# Patient Record
Sex: Male | Born: 1980 | Race: Black or African American | Hispanic: No | Marital: Married | Smoking: Current every day smoker
Health system: Southern US, Community
[De-identification: ages and names within clinical notes are randomized; demographics above are authoritative.]

## PROBLEM LIST (undated history)

## (undated) DIAGNOSIS — F1910 Other psychoactive substance abuse, uncomplicated: Secondary | ICD-10-CM

## (undated) DIAGNOSIS — F1010 Alcohol abuse, uncomplicated: Secondary | ICD-10-CM

---

## 2010-09-05 ENCOUNTER — Emergency Department (HOSPITAL_COMMUNITY)
Admission: EM | Admit: 2010-09-05 | Discharge: 2010-09-06 | Payer: Self-pay | Source: Home / Self Care | Admitting: Emergency Medicine

## 2010-09-10 ENCOUNTER — Emergency Department (HOSPITAL_COMMUNITY)
Admission: EM | Admit: 2010-09-10 | Discharge: 2010-09-10 | Payer: Self-pay | Source: Home / Self Care | Admitting: Emergency Medicine

## 2011-06-02 ENCOUNTER — Emergency Department (HOSPITAL_COMMUNITY)
Admission: EM | Admit: 2011-06-02 | Discharge: 2011-06-02 | Disposition: A | Discharge status: Home / Self Care | Payer: Self-pay | Attending: Emergency Medicine | Admitting: Emergency Medicine

## 2011-06-02 DIAGNOSIS — F10939 Alcohol use, unspecified with withdrawal, unspecified: Secondary | ICD-10-CM | POA: Insufficient documentation

## 2011-06-02 DIAGNOSIS — E876 Hypokalemia: Secondary | ICD-10-CM | POA: Insufficient documentation

## 2011-06-02 DIAGNOSIS — F1020 Alcohol dependence, uncomplicated: Secondary | ICD-10-CM | POA: Insufficient documentation

## 2011-06-02 DIAGNOSIS — F10239 Alcohol dependence with withdrawal, unspecified: Secondary | ICD-10-CM | POA: Insufficient documentation

## 2011-06-02 DIAGNOSIS — R112 Nausea with vomiting, unspecified: Secondary | ICD-10-CM | POA: Insufficient documentation

## 2011-06-02 DIAGNOSIS — F1010 Alcohol abuse, uncomplicated: Secondary | ICD-10-CM | POA: Insufficient documentation

## 2011-06-02 LAB — COMPREHENSIVE METABOLIC PANEL
ALT: 34 U/L (ref 0–53)
AST: 42 U/L — ABNORMAL HIGH (ref 0–37)
Albumin: 4.7 g/dL (ref 3.5–5.2)
Alkaline Phosphatase: 87 U/L (ref 39–117)
Alkaline Phosphatase: 79 U/L (ref 39–117)
BUN: 14 mg/dL (ref 6–23)
CO2: 27 mEq/L (ref 19–32)
Calcium: 10.2 mg/dL (ref 8.4–10.5)
Calcium: 9.3 mg/dL (ref 8.4–10.5)
Chloride: 94 mEq/L — ABNORMAL LOW (ref 96–112)
Creatinine, Ser: 1.5 mg/dL — ABNORMAL HIGH (ref 0.50–1.35)
GFR calc Af Amer: >60 mL/min (ref >60)
GFR calc Af Amer: >60 mL/min (ref >60)
GFR calc non Af Amer: >60 mL/min (ref >60)
Glucose, Bld: 114 mg/dL — ABNORMAL HIGH (ref 70–99)
Glucose, Bld: 107 mg/dL — ABNORMAL HIGH (ref 70–99)
Potassium: 3.9 mEq/L (ref 3.5–5.1)
Potassium: 2.9 mEq/L — ABNORMAL LOW (ref 3.5–5.1)
Sodium: 136 mEq/L (ref 135–145)
Total Protein: 8.6 g/dL — ABNORMAL HIGH (ref 6.0–8.3)

## 2011-06-02 LAB — CBC
HCT: 40.6 % (ref 39.0–52.0)
Hemoglobin: 14.6 g/dL (ref 13.0–17.0)
Hemoglobin: 13.1 g/dL (ref 13.0–17.0)
MCH: 30.2 pg (ref 26.0–34.0)
MCH: 28.7 pg (ref 26.0–34.0)
MCHC: 36 g/dL (ref 30.0–36.0)
Platelets: 121 10*3/uL — ABNORMAL LOW (ref 150–400)
RBC: 4.57 MIL/uL (ref 4.22–5.81)
RDW: 13.2 % (ref 11.5–15.5)
WBC: 7.3 10*3/uL (ref 4.0–10.5)

## 2011-06-02 LAB — LIPASE, BLOOD: Lipase: 26 U/L (ref 11–59)

## 2011-06-02 LAB — DIFFERENTIAL
Basophils Absolute: 0 10*3/uL (ref 0.0–0.1)
Basophils Relative: 1 % (ref 0–1)
Eosinophils Relative: 0 % (ref 0–5)
Lymphocytes Relative: 7 % — ABNORMAL LOW (ref 12–46)
Monocytes Absolute: 1 10*3/uL (ref 0.1–1.0)
Monocytes Relative: 12 % (ref 3–12)

## 2011-06-02 LAB — RAPID URINE DRUG SCREEN, HOSP PERFORMED
Barbiturates: NOT DETECTED
Cocaine: NOT DETECTED
Tetrahydrocannabinol: NOT DETECTED

## 2011-06-02 LAB — ETHANOL: Alcohol, Ethyl (B): <11 mg/dL (ref 0–11)

## 2011-07-06 ENCOUNTER — Emergency Department (HOSPITAL_COMMUNITY): Payer: Self-pay

## 2011-07-06 ENCOUNTER — Inpatient Hospital Stay (HOSPITAL_COMMUNITY)
Admission: EM | Admit: 2011-07-06 | Discharge: 2011-07-08 | DRG: 392 | Disposition: A | Discharge status: Home / Self Care | Payer: Self-pay | Attending: Family Medicine | Admitting: Family Medicine

## 2011-07-06 DIAGNOSIS — E860 Dehydration: Secondary | ICD-10-CM | POA: Diagnosis present

## 2011-07-06 DIAGNOSIS — F1010 Alcohol abuse, uncomplicated: Secondary | ICD-10-CM | POA: Diagnosis present

## 2011-07-06 DIAGNOSIS — N179 Acute kidney failure, unspecified: Secondary | ICD-10-CM | POA: Diagnosis present

## 2011-07-06 DIAGNOSIS — K2920 Alcoholic gastritis without bleeding: Principal | ICD-10-CM | POA: Diagnosis present

## 2011-07-06 LAB — URINE MICROSCOPIC-ADD ON

## 2011-07-06 LAB — URINALYSIS, ROUTINE W REFLEX MICROSCOPIC
Nitrite: NEGATIVE
Specific Gravity, Urine: 1.022 (ref 1.005–1.030)
Urobilinogen, UA: 1 mg/dL (ref 0.0–1.0)
pH: 5 (ref 5.0–8.0)

## 2011-07-06 LAB — RAPID URINE DRUG SCREEN, HOSP PERFORMED
Barbiturates: NOT DETECTED
Tetrahydrocannabinol: NOT DETECTED

## 2011-07-06 LAB — COMPREHENSIVE METABOLIC PANEL
ALT: 52 U/L (ref 0–53)
AST: 52 U/L — ABNORMAL HIGH (ref 0–37)
Albumin: 4.6 g/dL (ref 3.5–5.2)
Alkaline Phosphatase: 83 U/L (ref 39–117)
Chloride: 82 mEq/L — ABNORMAL LOW (ref 96–112)
Creatinine, Ser: 3.18 mg/dL — ABNORMAL HIGH (ref 0.50–1.35)
Potassium: 3.8 mEq/L (ref 3.5–5.1)
Sodium: 138 mEq/L (ref 135–145)
Total Bilirubin: 2.8 mg/dL — ABNORMAL HIGH (ref 0.3–1.2)

## 2011-07-06 LAB — PROTIME-INR
INR: 0.95 (ref 0.00–1.49)
Prothrombin Time: 12.9 seconds (ref 11.6–15.2)

## 2011-07-06 LAB — DIFFERENTIAL
Basophils Absolute: 0 10*3/uL (ref 0.0–0.1)
Eosinophils Absolute: 0 10*3/uL (ref 0.0–0.7)
Eosinophils Relative: 0 % (ref 0–5)
Lymphocytes Relative: 6 % — ABNORMAL LOW (ref 12–46)
Neutrophils Relative %: 77 % (ref 43–77)

## 2011-07-06 LAB — CBC
HCT: 44.2 % (ref 39.0–52.0)
Platelets: 224 10*3/uL (ref 150–400)
RBC: 5.11 MIL/uL (ref 4.22–5.81)
RDW: 13.3 % (ref 11.5–15.5)
WBC: 12.4 10*3/uL — ABNORMAL HIGH (ref 4.0–10.5)

## 2011-07-06 LAB — APTT: aPTT: 28 seconds (ref 24–37)

## 2011-07-06 LAB — LIPASE, BLOOD: Lipase: 23 U/L (ref 11–59)

## 2011-07-07 LAB — BASIC METABOLIC PANEL
CO2: 30 mEq/L (ref 19–32)
Calcium: 8.5 mg/dL (ref 8.4–10.5)
Creatinine, Ser: 0.97 mg/dL (ref 0.50–1.35)

## 2011-07-07 LAB — COMPREHENSIVE METABOLIC PANEL
ALT: 41 U/L (ref 0–53)
Alkaline Phosphatase: 64 U/L (ref 39–117)
CO2: 31 mEq/L (ref 19–32)
GFR calc Af Amer: >60 mL/min (ref >60)
Glucose, Bld: 105 mg/dL — ABNORMAL HIGH (ref 70–99)
Potassium: 3.6 mEq/L (ref 3.5–5.1)
Sodium: 137 mEq/L (ref 135–145)
Total Protein: 7.3 g/dL (ref 6.0–8.3)

## 2011-07-07 LAB — HEMOGLOBIN AND HEMATOCRIT, BLOOD: Hemoglobin: 12 g/dL — ABNORMAL LOW (ref 13.0–17.0)

## 2011-07-08 LAB — HEMOGLOBIN AND HEMATOCRIT, BLOOD
HCT: 34.6 % — ABNORMAL LOW (ref 39.0–52.0)
Hemoglobin: 11.3 g/dL — ABNORMAL LOW (ref 13.0–17.0)

## 2011-07-10 NOTE — H&P (Signed)
NAME:  Brian Black, OSE NO.:  [id number]  MEDICAL RECORD NO.:  [id number]  LOCATION:  1516                         FACILITY:  [facility]  PHYSICIAN:  Tarry Kos, MD       DATE OF BIRTH:  [birth year]  DATE OF ADMISSION:  07/06/2011 DATE OF DISCHARGE:                             HISTORY & PHYSICAL   CHIEF COMPLAINT:  Nausea, vomiting.  HISTORY OF PRESENT ILLNESS:  Brian Black is a 30 year old male with a history of alcohol abuse in the past, who drinks 40 to 80 ounces of beer a day, who comes in with 2 solid days of nausea and vomiting and he reports some coffee-ground nature to the vomit.  He has been having a significant alcohol problems since he turned 23.  He does have some epigastric abdominal pain.  Denies any diarrhea.  No chest pain.  He is not being eating for several days.  He has no history of renal problems in the past.  He has no liver damage, also that he is aware of.  He still has his gallbladder, still has his appendix.  We are being asked to admit the patient because he is currently renal failure with a creatinine of 3.19.  In August 2012, his creatinine was normal at 1.19.  PAST MEDICAL HISTORY:  Denies any other medical problems.  SOCIAL HISTORY:  He is a significant drinker.  He does smoke tobacco products.  Denies any other illicit drugs.  MEDICATIONS:  None.  ALLERGIES:  None.  PHYSICAL EXAMINATION:  VITAL SIGNS:  He is afebrile.  Blood pressure 124/90, pulse 107, respirations 16, 100% O2 sat on 2 liters nasal cannula. GENERAL:  He is alert and oriented x4, no apparent stress.  Calm and friendly. HEENT:  Extraocular muscles intact.  Pupils equal and reactive to light. Oropharynx is clear.  Mucous membranes dry. NECK:  No JVD.  No carotid bruits. COR:  Regular rate and rhythm without murmurs, rubs, or gallops. CHEST:  Clear to auscultation bilaterally.  No wheezes or rales. ABDOMEN:  Soft, nontender, and nondistended.  Positive bowel  sounds.  No hepatosplenomegaly.  No rebound, no guarding.  Nonacute abdomen. EXTREMITIES:  No clubbing, cyanosis, or edema. PSYCH:  Normal mood and affect.  At this time, no asterixis. NEURO:  No focal neurologic deficits. SKIN:  No rashes.  LABS:  His BUN and creatinine are significantly elevated at 24 and 3.18. His sodium is normal.  Potassium is normal.  His total bili is 2.8, which has been elevated in the past; in August, it was 3.3.  AST is 52, ALT is 52.  Alk phos is normal.  Lipase is normal.  Alcohol level is less than 11.  Urine drug screen is negative.  Urinalysis is positive for ketones, large amount of bilirubin.  White count is 12.4, hemoglobin is 15.  Ultrasound of the abdomen is pending.  ASSESSMENT AND PLAN:  This is a 30 year old male with nausea and vomiting likely secondary to alcohol abuse with acute renal failure. 1. Nausea and vomiting presumptively due to alcohol abuse.  I am going     to obtain an ultrasound of his abdomen, also to make sure that his  gallbladder is fine.  He had elevated total bili for several months     now.  He probably has the beginnings of liver damage.  I am going     to check coags and repeat a CMP in the morning.  He has been told     that he needs to stop drinking.  I am obtaining a substance abuse     counselor.  He has never had seizures in the past from stopping,     but he does get quite anxious.  I am going to provide him with some     Ativan as needed and Zofran along with thiamine and folate. 2. Acute renal failure secondary to dehydration.  We will obtain     ultrasound of his kidneys and aggressively fluid hydrate him.     Provide him some Zofran as needed. 3. We will also check serial hemoglobins as he has reported some     coffee-ground hematemesis.  His hemoglobin is normal.  I am going     to check q.8 H and H's. 4. The patient is full code.  Further recommendations is pending on     over hospital course.           ______________________________ Tarry Kos, MD     RD/MEDQ  D:  07/06/2011  T:  07/07/2011  Job:  [id number]  Electronically Signed by Tarry Kos MD on 07/10/2011 03:08:21 PM

## 2011-07-10 NOTE — Discharge Summary (Signed)
  NAMEMarland Kitchen  Brian Black, Brian Black  MEDICAL RECORD NO.:  Black  LOCATION:  1516                         FACILITY:  [facility]  PHYSICIAN:  Tarry Kos, MD       DATE OF BIRTH:  Brian Black, Brian Black  DATE OF ADMISSION:  07/06/2011 DATE OF DISCHARGE:  07/08/2011                              DISCHARGE SUMMARY   DISCHARGE DIAGNOSES: 1. Alcoholic gastritis. 2. Acute renal failure. 3. Prerenal azotemia secondary to dehydration. 4. Alcohol abuse. 5. Hematemesis.  HOSPITAL COURSE:  Brian Black is a 30 year old male who has a significant history of alcohol abuse for over 5 years who presents to the emergency department because of nausea, vomiting after binge drinking and did report some hematemesis.  He was also a significant renal failure with a creatinine over 3.  He was placed in the hospital, provided some IV fluids.  His renal failure completely resolved and his creatinine normalized and is back down to 0.97.  His hemoglobins were serially checked.  He did not have any further nausea and vomiting and his hemoglobin has been stable at 11.3.  He does have a significant alcohol abuse history.  He has been set up with [facility].  He has also had a social work counseling to set him up with outpatient treatment program for sustaining from alcohol.  He is very interested in quitting.  He was started on some Xanax last night, which has helped with his anxiety.  I am going to give him a temporary course of Xanax to help him sleep at night.  Otherwise, he did not have any signs of withdrawal here.  He did have abdominal ultrasound done, which was negative.  The patient being discharged home with follow up with [facility] and continued outpatient followup for with Alcoholics Anonymous.  PHYSICAL EXAMINATION:  GENERAL:  He has been alert and oriented x4, in no apparent distress, cooperative and friendly VITAL SIGNS:  Stable.  Afebrile. COR:  Regular rate and rhythm  without murmurs or  gallops. CHEST:  Clear to auscultation bilaterally.  No wheezes, rhonchi, or rales. ABDOMEN:  Soft, nontender, nondistended.  Positive bowel sounds.  No hepatosplenomegaly. EXTREMITIES:  No clubbing, cyanosis, or edema. PSYCHIATRIC:  Normal mood and affect. NEUROLOGIC:  No focal neurologic deficits.  No sign of withdrawal. SKIN:  No rashes.          ______________________________ Tarry Kos, MD     RD/MEDQ  D:  07/08/2011  T:  07/08/2011  Job:  [id number]  Electronically Signed by Tarry Kos MD on 07/10/2011 03:08:Black PM

## 2011-09-13 ENCOUNTER — Emergency Department (INDEPENDENT_AMBULATORY_CARE_PROVIDER_SITE_OTHER): Payer: Self-pay

## 2011-09-13 ENCOUNTER — Emergency Department (HOSPITAL_BASED_OUTPATIENT_CLINIC_OR_DEPARTMENT_OTHER)
Admission: EM | Admit: 2011-09-13 | Discharge: 2011-09-13 | Disposition: A | Discharge status: Home / Self Care | Payer: Self-pay | Attending: Emergency Medicine | Admitting: Emergency Medicine

## 2011-09-13 ENCOUNTER — Other Ambulatory Visit: Payer: Self-pay

## 2011-09-13 ENCOUNTER — Encounter: Payer: Self-pay | Admitting: *Deleted

## 2011-09-13 DIAGNOSIS — R8281 Pyuria: Secondary | ICD-10-CM

## 2011-09-13 DIAGNOSIS — R112 Nausea with vomiting, unspecified: Secondary | ICD-10-CM | POA: Insufficient documentation

## 2011-09-13 DIAGNOSIS — R079 Chest pain, unspecified: Secondary | ICD-10-CM

## 2011-09-13 DIAGNOSIS — E860 Dehydration: Secondary | ICD-10-CM | POA: Insufficient documentation

## 2011-09-13 DIAGNOSIS — F17200 Nicotine dependence, unspecified, uncomplicated: Secondary | ICD-10-CM | POA: Insufficient documentation

## 2011-09-13 DIAGNOSIS — F1010 Alcohol abuse, uncomplicated: Secondary | ICD-10-CM | POA: Insufficient documentation

## 2011-09-13 DIAGNOSIS — R0989 Other specified symptoms and signs involving the circulatory and respiratory systems: Secondary | ICD-10-CM

## 2011-09-13 DIAGNOSIS — R82998 Other abnormal findings in urine: Secondary | ICD-10-CM | POA: Insufficient documentation

## 2011-09-13 DIAGNOSIS — R05 Cough: Secondary | ICD-10-CM

## 2011-09-13 HISTORY — DX: Other psychoactive substance abuse, uncomplicated: F19.10

## 2011-09-13 HISTORY — DX: Alcohol abuse, uncomplicated: F10.10

## 2011-09-13 LAB — BASIC METABOLIC PANEL
BUN: 17 mg/dL (ref 6–23)
BUN: 16 mg/dL (ref 6–23)
Chloride: 93 mEq/L — ABNORMAL LOW (ref 96–112)
Chloride: 97 mEq/L (ref 96–112)
Creatinine, Ser: 1.7 mg/dL — ABNORMAL HIGH (ref 0.50–1.35)
GFR calc Af Amer: 62 mL/min — ABNORMAL LOW (ref >90)
GFR calc Af Amer: 79 mL/min — ABNORMAL LOW (ref >90)
GFR calc non Af Amer: 68 mL/min — ABNORMAL LOW (ref >90)
Glucose, Bld: 91 mg/dL (ref 70–99)
Potassium: 4.1 mEq/L (ref 3.5–5.1)
Sodium: 139 mEq/L (ref 135–145)

## 2011-09-13 LAB — CBC
Hemoglobin: 15.1 g/dL (ref 13.0–17.0)
MCH: 28.1 pg (ref 26.0–34.0)
RBC: 5.38 MIL/uL (ref 4.22–5.81)

## 2011-09-13 LAB — COMPREHENSIVE METABOLIC PANEL
Alkaline Phosphatase: 102 U/L (ref 39–117)
BUN: 17 mg/dL (ref 6–23)
CO2: 20 mEq/L (ref 19–32)
Chloride: 86 mEq/L — ABNORMAL LOW (ref 96–112)
GFR calc Af Amer: 54 mL/min — ABNORMAL LOW (ref >90)
Glucose, Bld: 97 mg/dL (ref 70–99)
Potassium: 4.5 mEq/L (ref 3.5–5.1)
Total Bilirubin: 2.7 mg/dL — ABNORMAL HIGH (ref 0.3–1.2)

## 2011-09-13 LAB — DIFFERENTIAL
Lymphocytes Relative: 3 % — ABNORMAL LOW (ref 12–46)
Lymphs Abs: 0.5 10*3/uL — ABNORMAL LOW (ref 0.7–4.0)
Monocytes Relative: 7 % (ref 3–12)
Neutro Abs: 12.3 10*3/uL — ABNORMAL HIGH (ref 1.7–7.7)
Neutrophils Relative %: 90 % — ABNORMAL HIGH (ref 43–77)

## 2011-09-13 LAB — URINALYSIS, ROUTINE W REFLEX MICROSCOPIC
Ketones, ur: >80 mg/dL — AB
Nitrite: NEGATIVE
Urobilinogen, UA: 1 mg/dL (ref 0.0–1.0)

## 2011-09-13 LAB — ETHANOL: Alcohol, Ethyl (B): <11 mg/dL (ref 0–11)

## 2011-09-13 LAB — RAPID URINE DRUG SCREEN, HOSP PERFORMED
Amphetamines: NOT DETECTED
Benzodiazepines: NOT DETECTED
Cocaine: NOT DETECTED
Opiates: NOT DETECTED

## 2011-09-13 LAB — LIPASE, BLOOD: Lipase: 25 U/L (ref 11–59)

## 2011-09-13 LAB — URINE MICROSCOPIC-ADD ON

## 2011-09-13 MED ORDER — SODIUM CHLORIDE 0.9 % IV BOLUS (SEPSIS)
1000.0000 mL | Freq: Once | INTRAVENOUS | Status: AC
  Administered 2011-09-13: 1000 mL via INTRAVENOUS

## 2011-09-13 MED ORDER — CEFTRIAXONE SODIUM 250 MG IJ SOLR
250.0000 mg | Freq: Once | INTRAMUSCULAR | Status: AC
  Administered 2011-09-13: 250 mg via INTRAMUSCULAR
  Filled 2011-09-13: qty 250

## 2011-09-13 MED ORDER — ONDANSETRON HCL 4 MG/2ML IJ SOLN
4.0000 mg | Freq: Once | INTRAMUSCULAR | Status: AC
  Administered 2011-09-13: 4 mg via INTRAVENOUS
  Filled 2011-09-13: qty 2

## 2011-09-13 MED ORDER — AZITHROMYCIN 250 MG PO TABS
1000.0000 mg | ORAL_TABLET | Freq: Once | ORAL | Status: AC
  Administered 2011-09-13: 1000 mg via ORAL
  Filled 2011-09-13: qty 4

## 2011-09-13 MED ORDER — MORPHINE SULFATE 4 MG/ML IJ SOLN
4.0000 mg | Freq: Once | INTRAMUSCULAR | Status: AC
  Administered 2011-09-13: 4 mg via INTRAVENOUS
  Filled 2011-09-13: qty 1

## 2011-09-13 MED ORDER — LIDOCAINE HCL (PF) 1 % IJ SOLN
INTRAMUSCULAR | Status: AC
  Administered 2011-09-13: 5 mL
  Filled 2011-09-13: qty 5

## 2011-09-13 NOTE — ED Provider Notes (Signed)
Patient signed out to me by Dr. Nicolasa Ducking at 4 PM pending placement for alcohol detox. He has been medically cleared and is here voluntarily. Per Dr. Scheryl Darter report is not suicidal and could be medically discharged however we are waiting detox placement. He has been seen and evaluated and information faxed and awaiting to hear back about bed availability.  7:30 PM- pt given option of waiting overnight for bed in AM at [facility].  Pt states he feels improved and would prefer to go home.  Terrence from mobile crisis is taking care of getting patient's contact information and he will be discharged.  Given strict return precautions and he is agreeable with this plan.   Ethelda Chick, MD 09/13/11 [phone]

## 2011-09-13 NOTE — ED Notes (Signed)
Pt to room 9 by ems, amb with quick steady gait in nad. Pt reports emesis x 8pm last night. Pt reports every day use of etoh, last drink Tuesday night, states he is trying to detox.

## 2011-09-13 NOTE — ED Notes (Signed)
Report received from Amy RN. Assuming care of patient at this time. 

## 2011-09-13 NOTE — ED Provider Notes (Signed)
History     CSN: [id number] Arrival date & time: 09/13/2011  7:11 AM   First MD Initiated Contact with Patient 09/13/11 [phone]      Chief Complaint  Patient presents with  . Nausea  . Emesis    (Consider location/radiation/quality/duration/timing/severity/associated sxs/prior treatment) HPI Patient is a 30 year old male who presents today by ambulance. Patient reports abdominal pain that is a 10 out of 10. He also describes having back pain and nausea and vomiting. The patient has stable vital signs and a sleepy appearing. He reports that he last drink alcohol on Tuesday night. He states that he is trying to detox from alcohol but does not desire placement for detox services today. Patient said he was previously drinking 3 beers a day. He denies any other substance use aside from tobacco. He has no history of pancreatitis or liver problems per his report. Pain is described as diffuse. Everything has made it worse and nothing has made it better. Patient describes this as an ache. There are no urinary symptoms. There are no associated or modifying factors. Past Medical History  Diagnosis Date  . Substance abuse   . Alcohol abuse     History reviewed. No pertinent past surgical history.  No family history on file.  History  Substance Use Topics  . Smoking status: Current Everyday Smoker  . Smokeless tobacco: Not on file  . Alcohol Use: Yes      Review of Systems  Constitutional: Negative.   HENT: Negative.   Eyes: Negative.   Respiratory: Negative.   Cardiovascular: Negative.   Gastrointestinal: Positive for nausea, vomiting and abdominal pain.  Genitourinary: Negative.   Musculoskeletal: Positive for back pain.  Skin: Negative.   Neurological: Negative.   Hematological: Negative.   Psychiatric/Behavioral: Negative.   All other systems reviewed and are negative.    Allergies  Review of patient's allergies indicates no known allergies.  Home Medications   Current  Outpatient Rx  Name Route Sig Dispense Refill  . GABAPENTIN 100 MG PO CAPS Oral Take 100 mg by mouth 3 (three) times daily.      . TRAZODONE HCL 150 MG PO TABS Oral Take 150 mg by mouth at bedtime.        BP 150/84  Pulse 81  Temp(Src) 97.6 F (36.4 C) (Oral)  Resp 20  Ht 6\' 2"  (1.88 m)  Wt 180 lb (81.647 kg)  BMI 23.11 kg/m2  SpO2 100%  Physical Exam  Nursing note and vitals reviewed. Constitutional: He is oriented to person, place, and time. He appears well-developed and well-nourished. No distress.       Somnolent  HENT:  Head: Normocephalic and atraumatic.  Eyes: Conjunctivae and EOM are normal. Pupils are equal, round, and reactive to light.  Neck: Normal range of motion.  Cardiovascular: Normal rate, regular rhythm, normal heart sounds and intact distal pulses.  Exam reveals no gallop and no friction rub.   No murmur heard. Pulmonary/Chest: Effort normal and breath sounds normal. No respiratory distress. He has no wheezes. He has no rales.  Abdominal: Soft. Bowel sounds are normal. He exhibits no distension. There is tenderness. There is no rebound and no guarding.       Patient complains of diffuse tenderness on palpation. Patient does not complain when his abdomen is palpated while I auscultate his lungs.  Musculoskeletal: Normal range of motion. He exhibits no edema and no tenderness.  Neurological: He is alert and oriented to person, place, and time. No cranial nerve  deficit. He exhibits normal muscle tone. Coordination normal.  Skin: Skin is warm and dry. No rash noted.  Psychiatric: He has a normal mood and affect.    ED Course  Procedures (including critical care time)   Date: 09/13/2011  Rate: 84  Rhythm: normal sinus rhythm  QRS Axis: normal  Intervals: PR prolonged  ST/T Wave abnormalities: nonspecific ST changes  Conduction Disutrbances:first-degree A-V block   Narrative Interpretation:   Old EKG Reviewed: none available  Labs Reviewed  CBC -  Abnormal; Notable for the following:    WBC 13.8 (*)    All other components within normal limits  DIFFERENTIAL - Abnormal; Notable for the following:    Neutrophils Relative 90 (*)    Neutro Abs 12.3 (*)    Lymphocytes Relative 3 (*)    Lymphs Abs 0.5 (*)    All other components within normal limits  COMPREHENSIVE METABOLIC PANEL - Abnormal; Notable for the following:    Chloride 86 (*)    Creatinine, Ser 1.90 (*)    Total Protein 8.9 (*)    AST 63 (*)    ALT 62 (*)    Total Bilirubin 2.7 (*)    GFR calc non Af Amer 47 (*)    GFR calc Af Amer 54 (*)    All other components within normal limits  URINALYSIS, ROUTINE W REFLEX MICROSCOPIC - Abnormal; Notable for the following:    Color, Urine AMBER (*) BIOCHEMICALS MAY BE AFFECTED BY COLOR   APPearance CLOUDY (*)    Hgb urine dipstick SMALL (*)    Bilirubin Urine MODERATE (*)    Ketones, ur >80 (*)    Protein, ur >300 (*)    Leukocytes, UA TRACE (*)    All other components within normal limits  BASIC METABOLIC PANEL - Abnormal; Notable for the following:    Chloride 93 (*)    Creatinine, Ser 1.70 (*)    GFR calc non Af Amer 54 (*)    GFR calc Af Amer 62 (*)    All other components within normal limits  URINE MICROSCOPIC-ADD ON - Abnormal; Notable for the following:    Bacteria, UA MANY (*)    Casts HYALINE CASTS (*)    All other components within normal limits  BASIC METABOLIC PANEL - Abnormal; Notable for the following:    Creatinine, Ser 1.40 (*)    Calcium 8.3 (*)    GFR calc non Af Amer 68 (*)    GFR calc Af Amer 79 (*)    All other components within normal limits  LIPASE, BLOOD  URINE RAPID DRUG SCREEN (HOSP PERFORMED)  ETHANOL   Dg Chest 2 View  09/13/2011  *RADIOLOGY REPORT*  Clinical Data: Chest pain, cough, congestion, smoking his  CHEST - 2 VIEW  Comparison: Chest x-ray of 07/06/2011  Findings: No active infiltrate or effusion is seen.  Mediastinal contours appear normal.  The heart is within normal limits in  size. No bony abnormality is seen.  IMPRESSION: No active lung disease.  Original Report Authenticated By: Juline Patch, M.D.     1. Alcohol abuse   2. Dehydration   3. Pyuria       MDM  Patient was evaluated by myself. Patient had presented by ambulance for evaluation of abdominal pain, nausea, and vomiting following stopping drinking alcohol 2 days ago. The patient did not require placement for detox services. The patient had complete hemodynamic stability on presentation. Evaluation for his abdominal pain was performed. Physical  exam was unremarkable. Laboratory workup was initiated. Patient was given IV fluids and nausea medication.  Patient did have elevated creatinine to 1.9 today. This is significantly elevated compared to his baseline of 0.9. Patient has had elevations as high several times in the past. He reports he has been vomiting numerous times secondary to withdrawal from alcohol. He is not vomiting here. He is hemodynamically stable. Following rehydration with 2 L of the patient's creatinine decreased to 1.7. A third liter was given patient's creatinine decreased to 1.4 which is a reasonable value for possible discharge. Patient had had workup for his abdominal pain. Transaminases were mildly elevated which is consistent with patient drinking history. Total bilirubin was also 2.7 but has been elevated more significantly previously. Patient did have a urinalysis with significant number of white blood cells. This was nitrate negative. Following patient's rehydration he decided that he will would like to receive detox. Upon arrival we had discussed whether the patient would require detox services today. He had denied wanting any assistance at bedtime. Patient did change his mind. At [facility]. An alcohol and urine drug screen was added to the patient's labs. Both of these were negative. Patient remained hemodynamically stable. He was feeling much better. At the time  of discharge the patient also complained of chest pain. Chest x-ray was performed and was negative. EKG was also performed and was within normal limits.  I spoke with patient regarding his urine results. The patient may just have pyuria given his evidence of dehydration and renal deficiency that was acute and now resolved today I did see it was prudent to treat him for a urethritis. Patient has no history of UTI and does not have urinary symptoms at this time. Patient was given a dose of Rocephin 250 mg IM as well as azithromycin 1 g by mouth. A swab was collected for gonorrhea and chlamydia. [facility] was seen by [facility]. At this time they're looking for a detox bed for him. Within 2 hours they should be able to tell us whether that'll be available for the patient. If this is not clearing 2 hours patient will be discharged home. He is stable for discharge. Currently signed out to oncoming physician at this time.        Cyndra Numbers, MD 09/13/11 [phone]

## 2011-09-13 NOTE — ED Notes (Signed)
Pt was made aware from mobile crisis team that there would be a wait for accepting inpatient beds at other facilities. Pt states he would rather go home for the night, and be notified in the morning regarding inpatient bed status. Terrence from [facility] stated they would get back in touch with the patient in the morning regarding treatment facilities. Pt is alert and oriented. Denies complaints other than mild abdominal pains. Denies HI/SI. States feels well enough for discharge, and girlfriend is in agreement with plan.

## 2011-09-13 NOTE — ED Notes (Signed)
[facility] phoned, pt requests detox for his alcohol addiction. Pt denies any si or hi, states "I just want to get some help to quit drinking..." pt is a/a/ox4, in nad, denies any c/o at this time.

## 2011-09-18 ENCOUNTER — Telehealth (HOSPITAL_COMMUNITY): Payer: Self-pay | Admitting: Emergency Medicine

## 2011-09-18 NOTE — ED Notes (Addendum)
+   Gonorrhea Patient treated with  rocephin and Zithromax-DHHS letter faxed- Patient notified after id'd x 2 and informed of need to notify partner to be treated.

## 2012-08-29 IMAGING — CR DG CHEST 2V
2 series · 2 of 2 positions shown · non-contrast
Comparison: Chest x-ray of 07/06/2011

CLINICAL DATA: Chest pain, cough, congestion, smoking his

CHEST - 2 VIEW

[w chest pa]
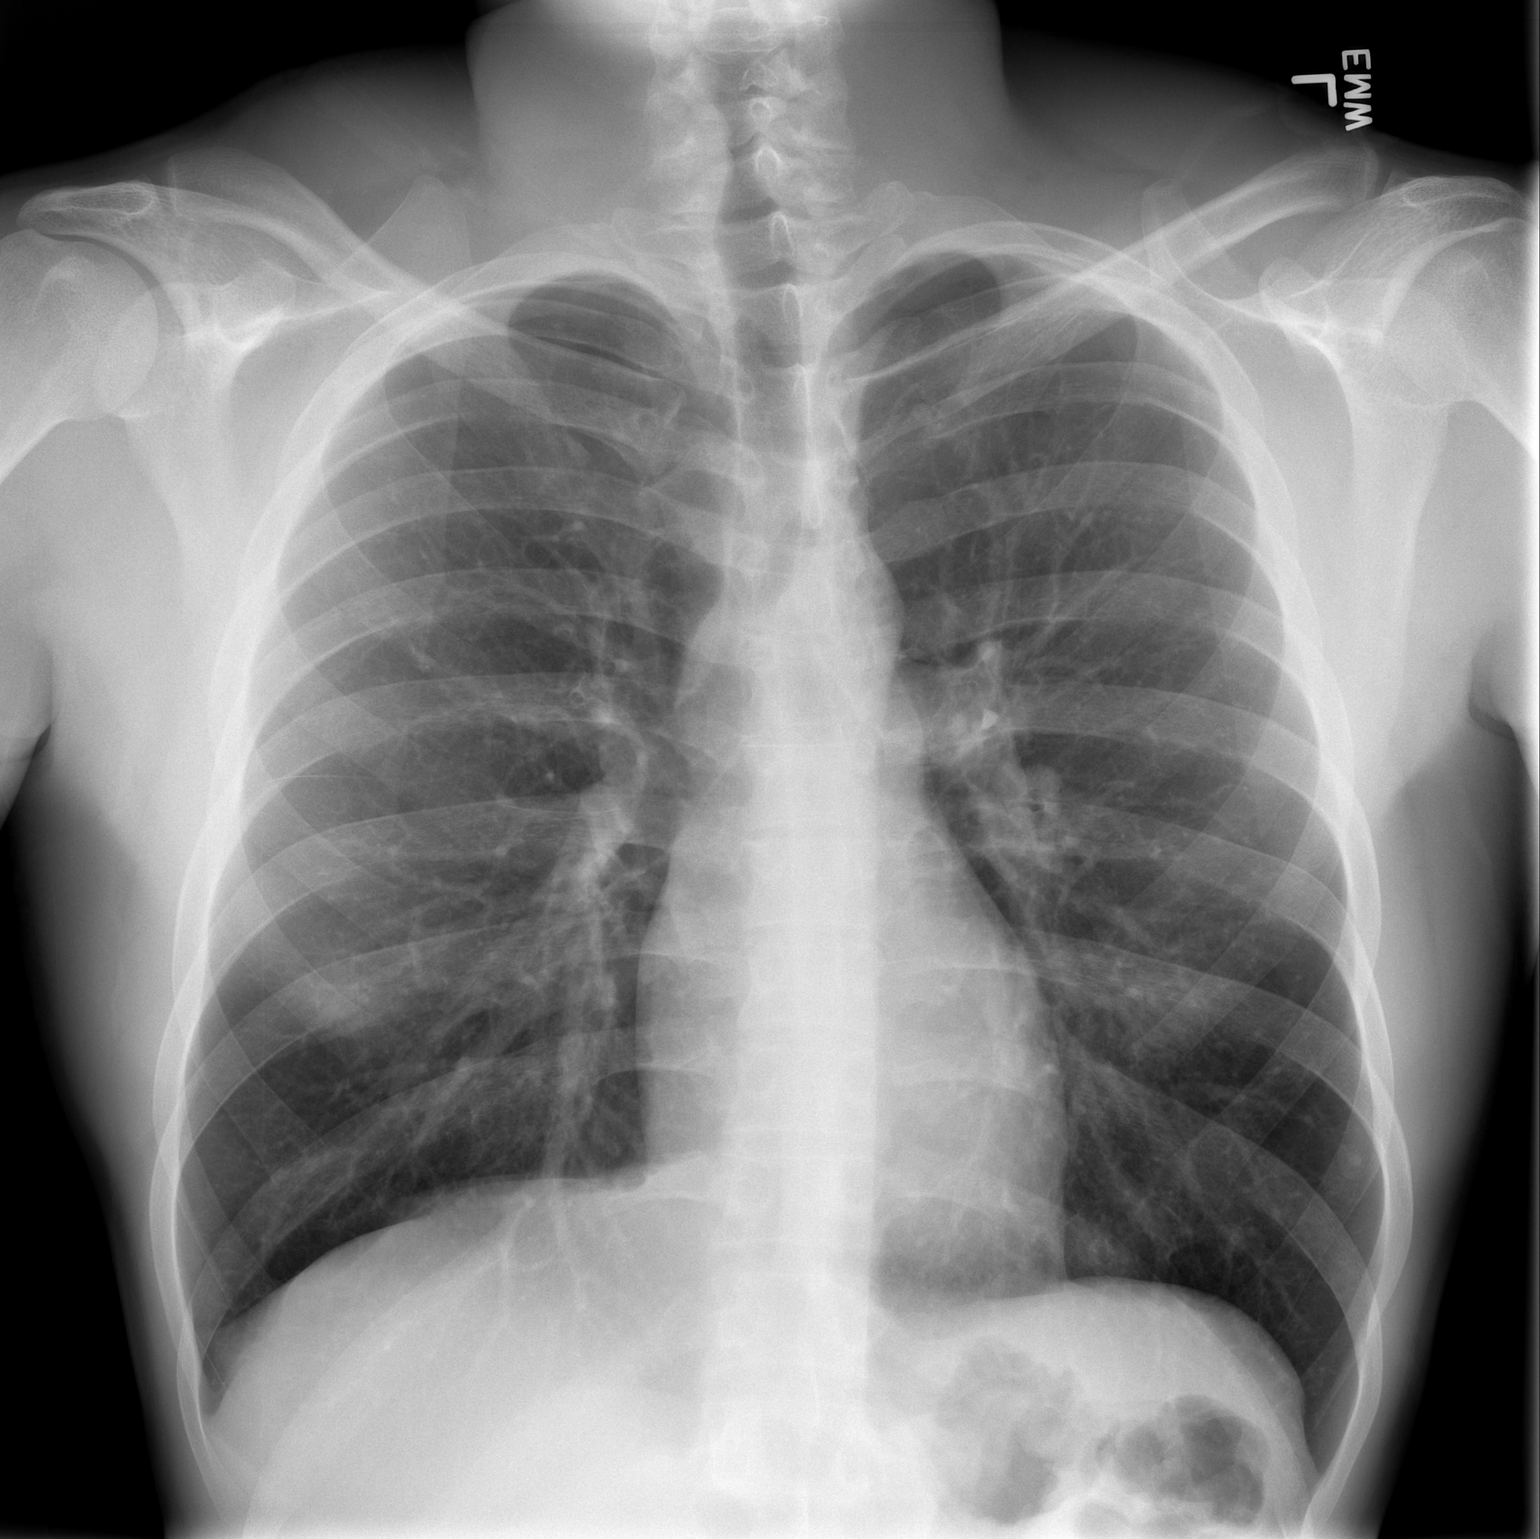

[w chest lat]
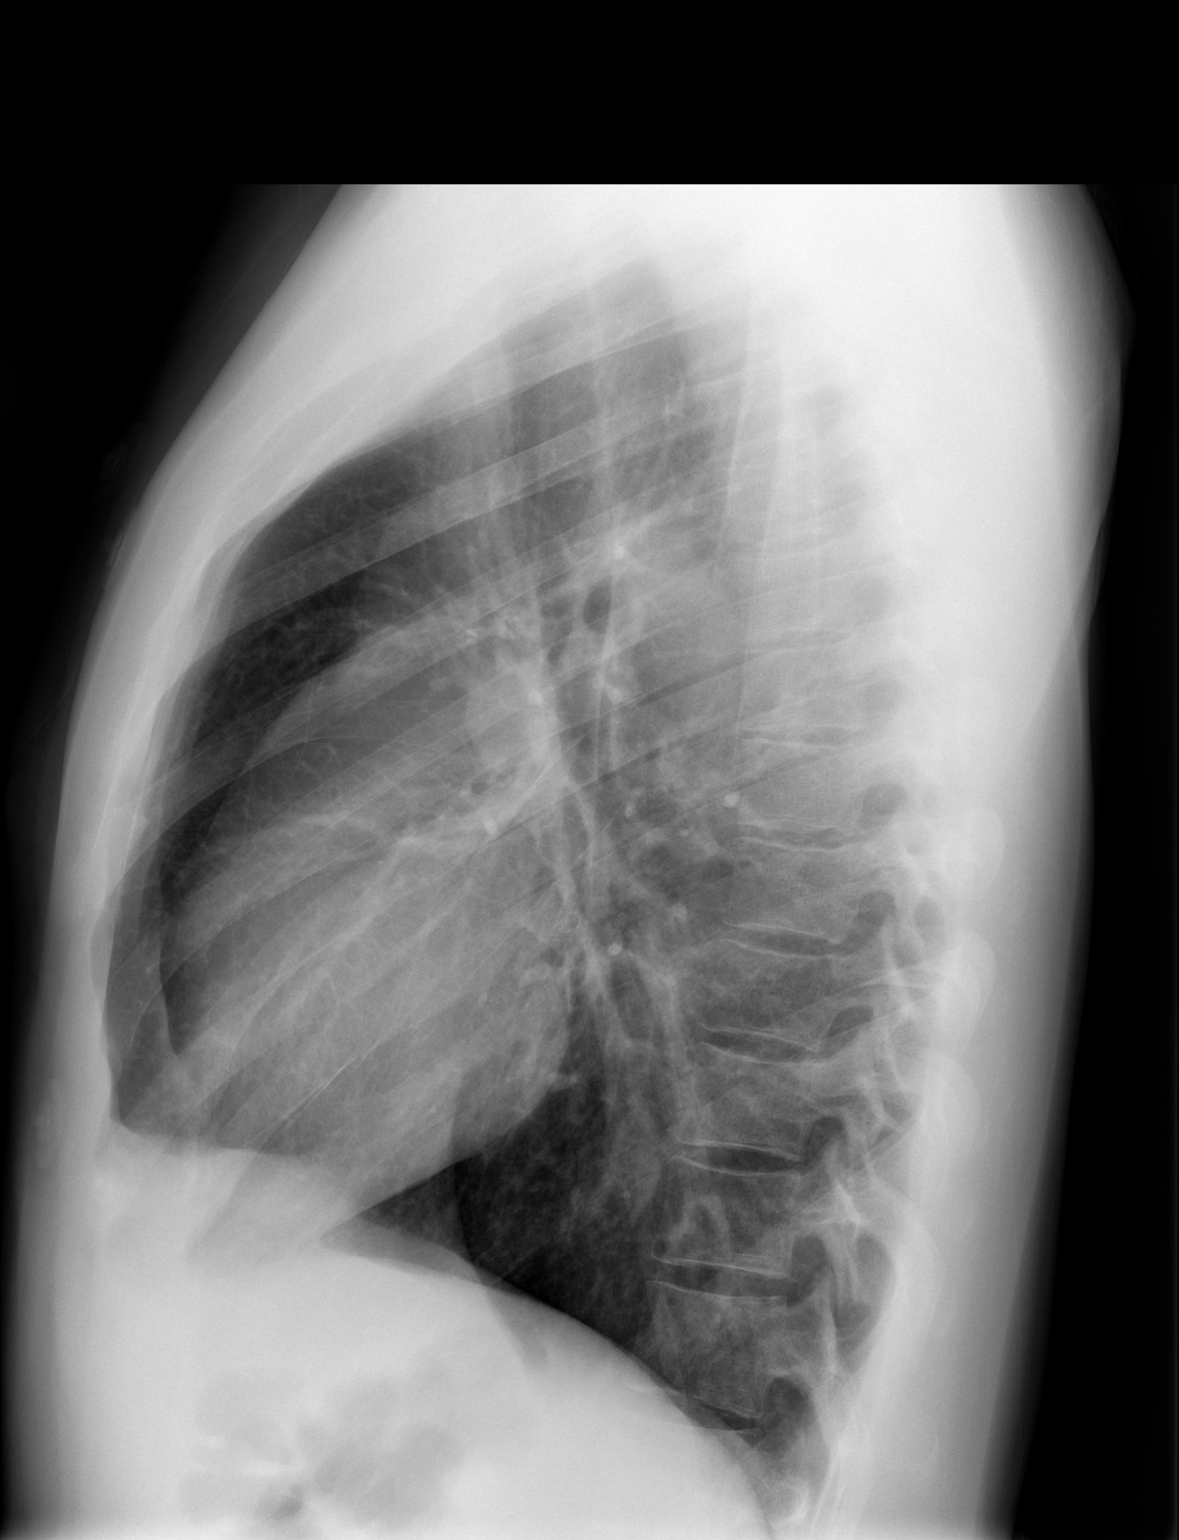

[2 of 2 positions shown; findings below may reference images not displayed]

FINDINGS: No active infiltrate or effusion is seen.  Mediastinal
contours appear normal.  The heart is within normal limits in size.
No bony abnormality is seen.
IMPRESSION: No active lung disease.

## 2016-09-27 ENCOUNTER — Ambulatory Visit (INDEPENDENT_AMBULATORY_CARE_PROVIDER_SITE_OTHER): Payer: BLUE CROSS/BLUE SHIELD | Admitting: Physician Assistant

## 2016-09-27 VITALS — BP 130/88 | HR 81 | Temp 98.2°F | Resp 16 | Ht 74.75 in | Wt 191.2 lb

## 2016-09-27 DIAGNOSIS — H5789 Other specified disorders of eye and adnexa: Secondary | ICD-10-CM

## 2016-09-27 DIAGNOSIS — H578 Other specified disorders of eye and adnexa: Secondary | ICD-10-CM

## 2016-09-27 DIAGNOSIS — Z23 Encounter for immunization: Secondary | ICD-10-CM

## 2016-09-27 DIAGNOSIS — L03213 Periorbital cellulitis: Secondary | ICD-10-CM | POA: Diagnosis not present

## 2016-09-27 DIAGNOSIS — H5712 Ocular pain, left eye: Secondary | ICD-10-CM | POA: Diagnosis not present

## 2016-09-27 MED ORDER — OFLOXACIN 0.3 % OP SOLN: OPHTHALMIC | 0 refills | Status: DC

## 2016-09-27 MED ORDER — CLINDAMYCIN HCL 300 MG PO CAPS: 300.0000 mg | ORAL_CAPSULE | Freq: Three times a day (TID) | ORAL | 0 refills | Status: AC

## 2016-09-27 NOTE — Patient Instructions (Addendum)
Please return if your pain worsens, and what is listed below.  If it is completely improved, you may stop the antibiotic after 5 days.  Preseptal Cellulitis, Adult Introduction Preseptal cellulitis-also called periorbital cellulitis-is an infection that can affect your eyelid and the soft tissues or skin that surround your eye. The infection may also affect the structures that produce and drain your tears. It does not affect your eye itself. What are the causes? This condition may be caused by:  Bacterial infection.  Long-term (chronic) sinus infections.  An object (foreign body) that is stuck behind the eye.  An injury that:  Goes through the eyelid tissues.  Causes an infection, such as an insect sting.  Fracture of the bone around the eye.  Infections that have spread from the eyelid or other structures around the eye.  Bite wounds.  Inflammation or infection of the lining membranes of the brain (meningitis).  An infection in the blood (septicemia).  Dental infection (abscess).  Viral infection. This is rare. What increases the risk? Risk factors for preseptal cellulitis include:  Participating in activities that increase your risk of trauma to the face or head, such as boxing or high-speed activities.  Having a weakened defense system (immune system).  Medical conditions, such as nasal polyps, that increase your risk for frequent or recurrent sinus infections.  Not receiving regular dental care. What are the signs or symptoms? Symptoms of this condition usually come on suddenly. Symptoms may include:  Red, hot, and swollen eyelids.  Fever.  Difficulty opening your eye.  Eye pain. How is this diagnosed? This condition may be diagnosed by an eye exam. You may also have tests, such as:  Blood tests.  CT scan.  MRI.  Spinal tap (lumbar puncture). This is a procedure that involves removing and examining a small amount of the fluid that surrounds the brain  and spinal cord. This checks for meningitis. How is this treated? Treatment for this condition will include antibiotic medicines. These may be given by mouth (orally), through an IV, or as a shot. Your health care provider may also recommend nasal decongestants to reduce swelling. Follow these instructions at home:  Take your antibiotic medicine as directed by your health care provider. Finish all of it even if you start to feel better.  Take medicines only as directed by your health care provider.  Drink enough fluid to keep your urine clear or pale yellow.  Do not use any tobacco products, including cigarettes, chewing tobacco, or electronic cigarettes. If you need help quitting, ask your health care provider.  Keep all follow-up visits as directed by your health care provider. These include any visits with an eye specialist (ophthalmologist) or dentist. Contact a health care provider if:  You have a fever.  Your eyelids become more red, warm, or swollen.  You have new symptoms.  Your symptoms do not get better with treatment. Get help right away if:  You develop double vision, or your vision becomes blurred or worsens in any way.  You have trouble moving your eyes.  Your eye looks like it is sticking out or bulging out (proptosis).  You develop a severe headache, severe neck pain, or neck stiffness.  You develop repeated vomiting. This information is not intended to replace advice given to you by your health care provider. Make sure you discuss any questions you have with your health care provider. Document Released: 10/27/2010 Document Revised: 03/01/2016 Document Reviewed: 09/20/2014  2017 Elsevier  IF you received an x-ray today, you will receive an invoice from [facility]. Please contact [facility] at [phone] with questions or concerns regarding your invoice.   IF you received labwork today, you will receive an invoice from [facility].  Please contact [facility] at [phone] with questions or concerns regarding your invoice.   Our billing staff will not be able to assist you with questions regarding bills from these companies.  You will be contacted with the lab results as soon as they are available. The fastest way to get your results is to activate your My Chart account. Instructions are located on the last page of this paperwork. If you have not heard from [place] regarding the results in 2 weeks, please contact this office.

## 2016-10-07 NOTE — Progress Notes (Signed)
[facility] [address] [place] [address] [phone]  Date:  09/27/2016   Name:  Brian Black   DOB:  01/30/1981   MRN:  [id number]  PCP:  No primary care provider on file.    History of Present Illness:  Brian Black is a 35 y.o. male patient who presents to [facility] for eye swelling. Left eye swelling that has worsened over the last 2 days.  He has no known foreign body.  Mild congestion.  He has mild pain with eye movement.  Wakes with discharge of eye but not matted shut.  No fever. Facial swelling at the eye and near bridge.     There are no active problems to display for this patient.   Past Medical History:  Diagnosis Date  . Alcohol abuse   . Substance abuse     No past surgical history on file.  Social History  Substance Use Topics  . Smoking status: Current Every Day Smoker  . Smokeless tobacco: Not on file  . Alcohol use Yes    No family history on file.  No Known Allergies  Medication list has been reviewed and updated.  Current Outpatient Prescriptions on File Prior to Visit  Medication Sig Dispense Refill  . gabapentin (NEURONTIN) 100 MG capsule Take 100 mg by mouth 3 (three) times daily.      . traZODone (DESYREL) 150 MG tablet Take 150 mg by mouth at bedtime.      No current facility-administered medications on file prior to visit.     ROS   Physical Examination: BP 130/88   Pulse 81   Temp 98.2 F (36.8 C) (Oral)   Resp 16   Ht 6' 2.75" (1.899 m)   Wt 191 lb 3.2 oz (86.7 kg)   SpO2 100%   BMI 24.06 kg/m  Ideal Body Weight: Weight in (lb) to have BMI = 25: 198.3  Physical Exam  Constitutional: He is oriented to person, place, and time. He appears well-developed and well-nourished. No distress.  HENT:  Head: Normocephalic and atraumatic.  Right Ear: Tympanic membrane, external ear and ear canal normal.  Left Ear: Tympanic membrane, external ear and ear canal normal.  Nose: Mucosal edema and  rhinorrhea present. Right sinus exhibits no maxillary sinus tenderness and no frontal sinus tenderness. Left sinus exhibits no maxillary sinus tenderness and no frontal sinus tenderness.  Mouth/Throat: No uvula swelling. No oropharyngeal exudate, posterior oropharyngeal edema or posterior oropharyngeal erythema.  Eyes: EOM and lids are normal. Pupils are equal, round, and reactive to light. Right conjunctiva is not injected. Left conjunctiva is injected. Right eye exhibits normal extraocular motion. Left eye exhibits normal extraocular motion.  Left eye with swelling of the upper lid.  There is mild swelling at the lower eye lid that spreads to the medial canthus and nasal bridge.  No erythema.  Mildly tender.  Normal ocular movement.    Neck: Trachea normal and full passive range of motion without pain. No edema and no erythema present.  Cardiovascular: Normal rate and regular rhythm.  Exam reveals no friction rub.   No murmur heard. Pulmonary/Chest: Effort normal. No respiratory distress. He has no decreased breath sounds. He has no wheezes. He has no rhonchi.  Neurological: He is alert and oriented to person, place, and time.  Skin: Skin is warm and dry. He is not diaphoretic.  Psychiatric: He has a normal mood and affect. His behavior is normal.     Assessment  and Plan: Brian Black is a 35 y.o. male who is here today for cc of eye redness. --will treat with oral and antibiotic.  --mild concern for preseptal cellulitis, though likely conjunctivitis Needs flu shot - Plan: Flu Vaccine QUAD 36+ mos IM  Preseptal cellulitis - Plan: clindamycin (CLEOCIN) 300 MG capsule, ofloxacin (OCUFLOX) 0.3 % ophthalmic solution  Eye redness - Plan: ofloxacin (OCUFLOX) 0.3 % ophthalmic solution  Eye swelling - Plan: ofloxacin (OCUFLOX) 0.3 % ophthalmic solution  Eye pain, left - Plan: ofloxacin (OCUFLOX) 0.3 % ophthalmic solution  Ivar Drape, PA-C [facility] 12/31/201710:14 PM

## 2019-02-25 ENCOUNTER — Other Ambulatory Visit: Payer: Self-pay | Admitting: Family Medicine

## 2019-02-25 ENCOUNTER — Encounter: Payer: Self-pay | Admitting: Family Medicine

## 2019-02-25 ENCOUNTER — Other Ambulatory Visit: Payer: Self-pay

## 2019-02-25 ENCOUNTER — Ambulatory Visit (INDEPENDENT_AMBULATORY_CARE_PROVIDER_SITE_OTHER): Payer: 59 | Admitting: Family Medicine

## 2019-02-25 VITALS — BP 118/70 | HR 84 | Temp 98.2°F | Resp 16 | Ht 73.0 in | Wt 187.0 lb

## 2019-02-25 DIAGNOSIS — F17200 Nicotine dependence, unspecified, uncomplicated: Secondary | ICD-10-CM

## 2019-02-25 DIAGNOSIS — Z818 Family history of other mental and behavioral disorders: Secondary | ICD-10-CM

## 2019-02-25 DIAGNOSIS — Z0001 Encounter for general adult medical examination with abnormal findings: Secondary | ICD-10-CM

## 2019-02-25 DIAGNOSIS — G4700 Insomnia, unspecified: Secondary | ICD-10-CM

## 2019-02-25 DIAGNOSIS — Z131 Encounter for screening for diabetes mellitus: Secondary | ICD-10-CM

## 2019-02-25 DIAGNOSIS — D171 Benign lipomatous neoplasm of skin and subcutaneous tissue of trunk: Secondary | ICD-10-CM

## 2019-02-25 DIAGNOSIS — Z20822 Contact with and (suspected) exposure to covid-19: Secondary | ICD-10-CM

## 2019-02-25 DIAGNOSIS — Z13220 Encounter for screening for lipoid disorders: Secondary | ICD-10-CM

## 2019-02-25 DIAGNOSIS — R6889 Other general symptoms and signs: Secondary | ICD-10-CM

## 2019-02-25 DIAGNOSIS — Z130 Encounter for screening for diseases of the blood and blood-forming organs and certain disorders involving the immune mechanism: Secondary | ICD-10-CM

## 2019-02-25 DIAGNOSIS — Z720 Tobacco use: Secondary | ICD-10-CM | POA: Diagnosis not present

## 2019-02-25 DIAGNOSIS — Z0000 Encounter for general adult medical examination without abnormal findings: Secondary | ICD-10-CM

## 2019-02-25 DIAGNOSIS — Z1329 Encounter for screening for other suspected endocrine disorder: Secondary | ICD-10-CM

## 2019-02-25 NOTE — Progress Notes (Signed)
Subjective:    Patient ID: Brian Black, male    DOB: 19-Sep-1981, 38 y.o.   MRN: [id number]  HPI Brian Black is a 38 y.o. male Presents today for: Chief Complaint  Patient presents with   Annual Exam   Last week had fever - Sunday and Monday, then had Covid test 5 days ago - still waiting on results. Had experienced cold symptoms, cough, runny nose, sore throat, fatigue, body aches, headache, and shortness of breath, additionally had diarrhea. Some decreased taste and smell last week.  Had Covid 19 test at [name].  Result is still pending. Started about a week and a half ago. Spoke to telemed doc through insurance who recommended test.  No known sick contacts, no recent travel.  Last measured fever 7 days ago, has been taking advil - last used yesterday for headache. Had not taken advil or other antipyretic on few days prior- no fevers during that time.  Overall symptoms have improved since 2 days ago.  Was isolating last week.   Some difficulty sleeping. Able to remain awake for 2-3 days at a time - able to stay up all night. Every week, same for years. Works on Teaching laboratory technician. Some increased energy and mind racing. Some spending sprees at times. Symptoms for years. No prior discussion of sx's. Brother with possible bipolar disorder. occasional times of depression. No suicide thoughts. Some anxiety around others at times. Introvert at times.   No chronic medical problems, no meds.   STI screening - same partner GF, 61 yo daughter with prior partner- lives in [place]. declines sti screening.   No FH of cancers known, other than maternal aunt.   Bump on left side - for years, no changes.   Genital concern - wants to make sure it is fine.  No testicle pain/swelling, penile d/c.   Immunization History  Administered Date(s) Administered   Influenza,inj,Quad PF,6+ Mos 09/27/2016  last td unknown, but within 10 years.   Depression screen PHQ 2/9 09/27/2016  Decreased  Interest 0  Down, Depressed, Hopeless 0  PHQ - 2 Score 0     Visual Acuity Screening   Right eye Left eye Both eyes  Without correction:     With correction: 20/15 20/13 20/13   no corrective lenses.   Dental: has dentist - only as needed appt, cleaning once per year.   Exercise: 5 days per week.   Alcohol: weekends - 4-5 beers total, 1-2 shots. DUI in college, no legal/family/work problems with alcohol, denies addiction. Detox in 2012. No problems since then. Stressors at that time.   Tobacco: Cigarettes and vaping. Has not tried to quit. Not ready to quit at this time.  previous quit attempt in college - gum, patch.  Social smoking.  4 min conversation.   There are no active problems to display for this patient.  Past Medical History:  Diagnosis Date   Alcohol abuse    Substance abuse ([place])    No past surgical history on file. No Known Allergies Prior to Admission medications   Medication Sig Start Date End Date Taking? Authorizing Provider  gabapentin (NEURONTIN) 100 MG capsule Take 100 mg by mouth 3 (three) times daily.      [provider]  ofloxacin (OCUFLOX) 0.3 % ophthalmic solution 2 drops in the eye every 3 hours for the first 2 days, then 1 drop four times per day for 5 days Patient not taking: Reported on 02/25/2019 09/27/16   Joretta Bachelor, [place]  traZODone (DESYREL) 150 MG tablet Take 150 mg by mouth at bedtime.     [provider]   Social History   Socioeconomic History   Marital status: Single    Spouse name: Not on file   Number of children: Not on file   Years of education: Not on file   Highest education level: Not on file  Occupational History   Not on file  Social Needs   Financial resource strain: Not on file   Food insecurity:    Worry: Not on file    Inability: Not on file   Transportation needs:    Medical: Not on file    Non-medical: Not on file  Tobacco Use   Smoking status: Current Every Day Smoker     Smokeless tobacco: Former Systems developer  Substance and Sexual Activity   Alcohol use: Yes   Drug use: No   Sexual activity: Not on file  Lifestyle   Physical activity:    Days per week: Not on file    Minutes per session: Not on file   Stress: Not on file  Relationships   Social connections:    Talks on phone: Not on file    Gets together: Not on file    Attends religious service: Not on file    Active member of club or organization: Not on file    Attends meetings of clubs or organizations: Not on file    Relationship status: Not on file   Intimate partner violence:    Fear of current or ex partner: Not on file    Emotionally abused: Not on file    Physically abused: Not on file    Forced sexual activity: Not on file  Other Topics Concern   Not on file  Social History Narrative   Not on file    Review of Systems 13 point review of systems per patient health survey noted.  Negative other than as indicated above or in HPI.      Objective:   Physical Exam Vitals signs and nursing note reviewed.  Constitutional:      Appearance: He is well-developed.  HENT:     Head: Normocephalic and atraumatic.     Right Ear: External ear normal.     Left Ear: External ear normal.  Eyes:     Conjunctiva/sclera: Conjunctivae normal.     Pupils: Pupils are equal, round, and reactive to light.  Neck:     Musculoskeletal: Normal range of motion and neck supple.     Thyroid: No thyromegaly.  Cardiovascular:     Rate and Rhythm: Normal rate and regular rhythm.     Heart sounds: Normal heart sounds.  Pulmonary:     Effort: Pulmonary effort is normal. No respiratory distress.     Breath sounds: Normal breath sounds. No wheezing.  Abdominal:     General: There is no distension.     Palpations: Abdomen is soft.     Tenderness: There is no abdominal tenderness.     Hernia: There is no hernia in the right inguinal area or left inguinal area.  Genitourinary:    Prostate: Normal.   Musculoskeletal: Normal range of motion.        General: No tenderness.  Lymphadenopathy:     Cervical: No cervical adenopathy.  Skin:    General: Skin is warm and dry.  Neurological:     Mental Status: He is alert and oriented to person, place, and time.  Deep Tendon Reflexes: Reflexes are normal and symmetric.  Psychiatric:        Behavior: Behavior normal.    Vitals:   02/25/19 0820 02/25/19 0935  BP: (!) 144/96 118/70  Pulse: 84   Resp: 16   Temp: 98.2 F (36.8 C)   TempSrc: Oral   SpO2: 99%   Weight: 187 lb (84.8 kg)   Height: 6\' 1"  (1.854 m)    Small lipoma - mobile soft tissue area left lower flank. Nontender.  Over 40 min visit, with greater than 50% counseling.     Assessment & Plan:   Brian Black is a 38 y.o. male Annual physical exam  - -anticipatory guidance as below in AVS, screening labs above. Health maintenance items as above in HPI discussed/recommended as applicable.   Suspected Covid-19 Virus Infection  - testing pending.  Symptomatic care, home monitoring and RTC precautions. Return to work guidelines discussed.   Insomnia, unspecified type - Plan: Ambulatory referral to Psychiatry Family history of bipolar disorder - Plan: Ambulatory referral to Psychiatry  - suspicious for bipolar with manic symptoms and FH. Handout given on insomnia, refer to psychiatry. rtc precautions if worse.  Tobacco abuse  - contemplative stage. Handout given. Resources discussed when ready to quit.   Lipoma of torso  - suspected lipoma, monitor for changes, option of derm/surgical eval.   Screening for diabetes mellitus - Plan: Comprehensive metabolic panel  Screening for thyroid disorder - Plan: TSH  Screening, anemia, deficiency, iron - Plan: CBC  Screening for hyperlipidemia - Plan: Lipid panel   No orders of the defined types were placed in this encounter.  Patient Instructions  See information on sleep below, but I would like you to meet with  psychiatry as symptoms may be due to Bipolar disorder.   When you are ready to quit smoking, let me know if I can help.   Symptoms last week are suspicious for COVID-19, but as you are improving and no recent fever, less likely contagious at this time.  If any worsening symptoms, be evaluated right away.  I do not feel any areas of concern on genital exam, but if you do notice any lumps, pain, swelling, or any new areas/changes, please return to discuss further and repeat exam.  Area on your side appears to be a lipoma or a benign tissue.  See information below.  Recheck in 3 months to follow-up with some of the discussions from today, sooner if any worsening symptoms  Return to the clinic or go to the nearest emergency room if any of your symptoms worsen or new symptoms occur.  Keeping you healthy  Get these tests  Blood pressure- Have your blood pressure checked once a year by your healthcare provider.  Normal blood pressure is 120/80.  Weight- Have your body mass index (BMI) calculated to screen for obesity.  BMI is a measure of body fat based on height and weight. You can also calculate your own BMI at [employer].it.  Cholesterol- Have your cholesterol checked regularly starting at age 93, sooner may be necessary if you have diabetes, high blood pressure, if a family member developed heart diseases at an early age or if you smoke.   Chlamydia, HIV, and other sexual transmitted disease- Get screened each year until the age of 64 then within three months of each new sexual partner.  Diabetes- Have your blood sugar checked regularly if you have high blood pressure, high cholesterol, a family history of diabetes or if you  are overweight.  Get these vaccines  Flu shot- Every fall.  Tetanus shot- Every 10 years.  Menactra- Single dose; prevents meningitis.  Take these steps  Don't smoke- If you do smoke, ask your healthcare provider about quitting. For tips on how to  quit, go to www.smokefree.gov or call 1-800-QUIT-NOW.  Be physically active- Exercise 5 days a week for at least 30 minutes.  If you are not already physically active start slow and gradually work up to 30 minutes of moderate physical activity.  Examples of moderate activity include walking briskly, mowing the yard, dancing, swimming bicycling, etc.  Eat a healthy diet- Eat a variety of healthy foods such as fruits, vegetables, low fat milk, low fat cheese, yogurt, lean meats, poultry, fish, beans, tofu, etc.  For more information on healthy eating, go to www.thenutritionsource.org  Drink alcohol in moderation- Limit alcohol intake two drinks or less a day.  Never drink and drive.  Dentist- Brush and floss teeth twice daily; visit your dentis twice a year.  Depression-Your emotional health is as important as your physical health.  If you're feeling down, losing interest in things you normally enjoy please talk with your healthcare provider.  Gun Safety- If you keep a gun in your home, keep it unloaded and with the safety lock on.  Bullets should be stored separately.  Helmet use- Always wear a helmet when riding a motorcycle, bicycle, rollerblading or skateboarding.  Safe sex- If you may be exposed to a sexually transmitted infection, use a condom  Seat belts- Seat bels can save your life; always wear one.  Smoke/Carbon Monoxide detectors- These detectors need to be installed on the appropriate level of your home.  Replace batteries at least once a year.  Skin Cancer- When out in the sun, cover up and use sunscreen SPF 15 or higher.  Violence- If anyone is threatening or hurting you, please tell your healthcare provider.   Lipoma  A lipoma is a noncancerous (benign) tumor that is made up of fat cells. This is a very common type of soft-tissue growth. Lipomas are usually found under the skin (subcutaneous). They may occur in any tissue of the body that contains fat. Common areas for  lipomas to appear include the back, shoulders, buttocks, and thighs.  Lipomas grow slowly, and they are usually painless. Most lipomas do not cause problems and do not require treatment. What are the causes? The cause of this condition is not known. What increases the risk? You are more likely to develop this condition if:  You are 73-64 years old.  You have a family history of lipomas. What are the signs or symptoms? A lipoma usually appears as a small, round bump under the skin. In most cases, the lump will:  Feel soft or rubbery.  Not cause pain or other symptoms. However, if a lipoma is located in an area where it pushes on nerves, it can become painful or cause other symptoms. How is this diagnosed? A lipoma can usually be diagnosed with a physical exam. You may also have tests to confirm the diagnosis and to rule out other conditions. Tests may include:  Imaging tests, such as a CT scan or MRI.  Removal of a tissue sample to be looked at under a microscope (biopsy). How is this treated? Treatment for this condition depends on the size of the lipoma and whether it is causing any symptoms.  For small lipomas that are not causing problems, no treatment is needed.  If  a lipoma is bigger or it causes problems, surgery may be done to remove the lipoma. Lipomas can also be removed to improve appearance. Most often, the procedure is done after applying a medicine that numbs the area (local anesthetic). Follow these instructions at home:  Watch your lipoma for any changes.  Keep all follow-up visits as told by your health care provider. This is important. Contact a health care provider if:  Your lipoma becomes larger or hard.  Your lipoma becomes painful, red, or increasingly swollen. These could be signs of infection or a more serious condition. Get help right away if:  You develop tingling or numbness in an area near the lipoma. This could indicate that the lipoma is causing  nerve damage. Summary  A lipoma is a noncancerous tumor that is made up of fat cells.  Most lipomas do not cause problems and do not require treatment.  If a lipoma is bigger or it causes problems, surgery may be done to remove the lipoma. This information is not intended to replace advice given to you by your health care provider. Make sure you discuss any questions you have with your health care provider. Document Released: 09/14/2002 Document Revised: 09/10/2017 Document Reviewed: 09/10/2017 Elsevier Interactive Patient Education  2019 [place].    Insomnia Insomnia is a sleep disorder that makes it difficult to fall asleep or stay asleep. Insomnia can cause fatigue, low energy, difficulty concentrating, mood swings, and poor performance at work or school. There are three different ways to classify insomnia:  Difficulty falling asleep.  Difficulty staying asleep.  Waking up too early in the morning. Any type of insomnia can be long-term (chronic) or short-term (acute). Both are common. Short-term insomnia usually lasts for three months or less. Chronic insomnia occurs at least three times a week for longer than three months. What are the causes? Insomnia may be caused by another condition, situation, or substance, such as:  Anxiety.  Certain medicines.  Gastroesophageal reflux disease (GERD) or other gastrointestinal conditions.  Asthma or other breathing conditions.  Restless legs syndrome, sleep apnea, or other sleep disorders.  Chronic pain.  Menopause.  Stroke.  Abuse of alcohol, tobacco, or illegal drugs.  Mental health conditions, such as depression.  Caffeine.  Neurological disorders, such as Alzheimer's disease.  An overactive thyroid (hyperthyroidism). Sometimes, the cause of insomnia may not be known. What increases the risk? Risk factors for insomnia include:  Gender. Women are affected more often than men.  Age. Insomnia is more common as  you get older.  Stress.  Lack of exercise.  Irregular work schedule or working night shifts.  Traveling between different time zones.  Certain medical and mental health conditions. What are the signs or symptoms? If you have insomnia, the main symptom is having trouble falling asleep or having trouble staying asleep. This may lead to other symptoms, such as:  Feeling fatigued or having low energy.  Feeling nervous about going to sleep.  Not feeling rested in the morning.  Having trouble concentrating.  Feeling irritable, anxious, or depressed. How is this diagnosed? This condition may be diagnosed based on:  Your symptoms and medical history. Your health care provider may ask about: ? Your sleep habits. ? Any medical conditions you have. ? Your mental health.  A physical exam. How is this treated? Treatment for insomnia depends on the cause. Treatment may focus on treating an underlying condition that is causing insomnia. Treatment may also include:  Medicines to help you sleep.  Counseling or therapy.  Lifestyle adjustments to help you sleep better. Follow these instructions at home: Eating and drinking   Limit or avoid alcohol, caffeinated beverages, and cigarettes, especially close to bedtime. These can disrupt your sleep.  Do not eat a large meal or eat spicy foods right before bedtime. This can lead to digestive discomfort that can make it hard for you to sleep. Sleep habits   Keep a sleep diary to help you and your health care provider figure out what could be causing your insomnia. Write down: ? When you sleep. ? When you wake up during the night. ? How well you sleep. ? How rested you feel the next day. ? Any side effects of medicines you are taking. ? What you eat and drink.  Make your bedroom a dark, comfortable place where it is easy to fall asleep. ? Put up shades or blackout curtains to block light from outside. ? Use a white noise machine to  block noise. ? Keep the temperature cool.  Limit screen use before bedtime. This includes: ? Watching TV. ? Using your smartphone, tablet, or computer.  Stick to a routine that includes going to bed and waking up at the same times every day and night. This can help you fall asleep faster. Consider making a quiet activity, such as reading, part of your nighttime routine.  Try to avoid taking naps during the day so that you sleep better at night.  Get out of bed if you are still awake after 15 minutes of trying to sleep. Keep the lights down, but try reading or doing a quiet activity. When you feel sleepy, go back to bed. General instructions  Take over-the-counter and prescription medicines only as told by your health care provider.  Exercise regularly, as told by your health care provider. Avoid exercise starting several hours before bedtime.  Use relaxation techniques to manage stress. Ask your health care provider to suggest some techniques that may work well for you. These may include: ? Breathing exercises. ? Routines to release muscle tension. ? Visualizing peaceful scenes.  Make sure that you drive carefully. Avoid driving if you feel very sleepy.  Keep all follow-up visits as told by your health care provider. This is important. Contact a health care provider if:  You are tired throughout the day.  You have trouble in your daily routine due to sleepiness.  You continue to have sleep problems, or your sleep problems get worse. Get help right away if:  You have serious thoughts about hurting yourself or someone else. If you ever feel like you may hurt yourself or others, or have thoughts about taking your own life, get help right away. You can go to your nearest emergency department or call:  Your local emergency services (911 in the [place]  A suicide crisis helpline, such as the [facility] at 773-570-0149. This is open 24 hours a  day. Summary  Insomnia is a sleep disorder that makes it difficult to fall asleep or stay asleep.  Insomnia can be long-term (chronic) or short-term (acute).  Treatment for insomnia depends on the cause. Treatment may focus on treating an underlying condition that is causing insomnia.  Keep a sleep diary to help you and your health care provider figure out what could be causing your insomnia. This information is not intended to replace advice given to you by your health care provider. Make sure you discuss any questions you have with your health care provider.  Document Released: 09/21/2000 Document Revised: 07/04/2017 Document Reviewed: 07/04/2017 Elsevier Interactive Patient Education  2019 [employer].    Steps to Quit Smoking  Smoking tobacco can be harmful to your health and can affect almost every organ in your body. Smoking puts you, and those around you, at risk for developing many serious chronic diseases. Quitting smoking is difficult, but it is one of the best things that you can do for your health. It is never too late to quit. What are the benefits of quitting smoking? When you quit smoking, you lower your risk of developing serious diseases and conditions, such as:  Lung cancer or lung disease, such as COPD.  Heart disease.  Stroke.  Heart attack.  Infertility.  Osteoporosis and bone fractures. Additionally, symptoms such as coughing, wheezing, and shortness of breath may get better when you quit. You may also find that you get sick less often because your body is stronger at fighting off colds and infections. If you are pregnant, quitting smoking can help to reduce your chances of having a baby of low birth weight. How do I get ready to quit? When you decide to quit smoking, create a plan to make sure that you are successful. Before you quit:  Pick a date to quit. Set a date within the next two weeks to give you time to prepare.  Write down the reasons why you  are quitting. Keep this list in places where you will see it often, such as on your bathroom mirror or in your car or wallet.  Identify the people, places, things, and activities that make you want to smoke (triggers) and avoid them. Make sure to take these actions: ? Throw away all cigarettes at home, at work, and in your car. ? Throw away smoking accessories, such as Scientist, research (medical). ? Clean your car and make sure to empty the ashtray. ? Clean your home, including curtains and carpets.  Tell your family, friends, and coworkers that you are quitting. Support from your loved ones can make quitting easier.  Talk with your health care provider about your options for quitting smoking.  Find out what treatment options are covered by your health insurance. What strategies can I use to quit smoking? Talk with your healthcare provider about different strategies to quit smoking. Some strategies include:  Quitting smoking altogether instead of gradually lessening how much you smoke over a period of time. Research shows that quitting cold [place] is more successful than gradually quitting.  Attending in-person counseling to help you build problem-solving skills. You are more likely to have success in quitting if you attend several counseling sessions. Even short sessions of 10 minutes can be effective.  Finding resources and support systems that can help you to quit smoking and remain smoke-free after you quit. These resources are most helpful when you use them often. They can include: ? Online chats with a Social worker. ? Telephone quitlines. ? Careers information officer. ? Support groups or group counseling. ? Text messaging programs. ? Mobile phone applications.  Taking medicines to help you quit smoking. (If you are pregnant or breastfeeding, talk with your health care provider first.) Some medicines contain nicotine and some do not. Both types of medicines help with cravings, but the  medicines that include nicotine help to relieve withdrawal symptoms. Your health care provider may recommend: ? Nicotine patches, gum, or lozenges. ? Nicotine inhalers or sprays. ? Non-nicotine medicine that is taken by mouth. Talk with your health care provider  about combining strategies, such as taking medicines while you are also receiving in-person counseling. Using these two strategies together makes you more likely to succeed in quitting than if you used either strategy on its own. If you are pregnant or breastfeeding, talk with your health care provider about finding counseling or other support strategies to quit smoking. Do not take medicine to help you quit smoking unless told to do so by your health care provider. What things can I do to make it easier to quit? Quitting smoking might feel overwhelming at first, but there is a lot that you can do to make it easier. Take these important actions:  Reach out to your family and friends and ask that they support and encourage you during this time. Call telephone quitlines, reach out to support groups, or work with a counselor for support.  Ask people who smoke to avoid smoking around you.  Avoid places that trigger you to smoke, such as bars, parties, or smoke-break areas at work.  Spend time around people who do not smoke.  Lessen stress in your life, because stress can be a smoking trigger for some people. To lessen stress, try: ? Exercising regularly. ? Deep-breathing exercises. ? Yoga. ? Meditating. ? Performing a body scan. This involves closing your eyes, scanning your body from head to toe, and noticing which parts of your body are particularly tense. Purposefully relax the muscles in those areas.  Download or purchase mobile phone or tablet apps (applications) that can help you stick to your quit plan by providing reminders, tips, and encouragement. There are many free apps, such as QuitGuide from the [employer] for Disease  Control and Prevention). You can find other support for quitting smoking (smoking cessation) through smokefree.gov and other websites. How will I feel when I quit smoking? Within the first 24 hours of quitting smoking, you may start to feel some withdrawal symptoms. These symptoms are usually most noticeable 2-3 days after quitting, but they usually do not last beyond 2-3 weeks. Changes or symptoms that you might experience include:  Mood swings.  Restlessness, anxiety, or irritation.  Difficulty concentrating.  Dizziness.  Strong cravings for sugary foods in addition to nicotine.  Mild weight gain.  Constipation.  Nausea.  Coughing or a sore throat.  Changes in how your medicines work in your body.  A depressed mood.  Difficulty sleeping (insomnia). After the first 2-3 weeks of quitting, you may start to notice more positive results, such as:  Improved sense of smell and taste.  Decreased coughing and sore throat.  Slower heart rate.  Lower blood pressure.  Clearer skin.  The ability to breathe more easily.  Fewer sick days. Quitting smoking is very challenging for most people. Do not get discouraged if you are not successful the first time. Some people need to make many attempts to quit before they achieve long-term success. Do your best to stick to your quit plan, and talk with your health care provider if you have any questions or concerns. This information is not intended to replace advice given to you by your health care provider. Make sure you discuss any questions you have with your health care provider. Document Released: 09/18/2001 Document Revised: 04/30/2017 Document Reviewed: 02/08/2015 Elsevier Interactive Patient Education  2019 [employer].   Signed,   Merri Ray, MD [facility].  03/01/19 9:22 AM

## 2019-02-25 NOTE — Patient Instructions (Addendum)
See information on sleep below, but I would like you to meet with psychiatry as symptoms may be due to Bipolar disorder.   When you are ready to quit smoking, let me know if I can help.   Symptoms last week are suspicious for COVID-19, but as you are improving and no recent fever, less likely contagious at this time.  If any worsening symptoms, be evaluated right away.  I do not feel any areas of concern on genital exam, but if you do notice any lumps, pain, swelling, or any new areas/changes, please return to discuss further and repeat exam.  Area on your side appears to be a lipoma or a benign tissue.  See information below.  Recheck in 3 months to follow-up with some of the discussions from today, sooner if any worsening symptoms  Return to the clinic or go to the nearest emergency room if any of your symptoms worsen or new symptoms occur.  Keeping you healthy  Get these tests  Blood pressure- Have your blood pressure checked once a year by your healthcare provider.  Normal blood pressure is 120/80.  Weight- Have your body mass index (BMI) calculated to screen for obesity.  BMI is a measure of body fat based on height and weight. You can also calculate your own BMI at [employer].it.  Cholesterol- Have your cholesterol checked regularly starting at age 84, sooner may be necessary if you have diabetes, high blood pressure, if a family member developed heart diseases at an early age or if you smoke.   Chlamydia, HIV, and other sexual transmitted disease- Get screened each year until the age of 16 then within three months of each new sexual partner.  Diabetes- Have your blood sugar checked regularly if you have high blood pressure, high cholesterol, a family history of diabetes or if you are overweight.  Get these vaccines  Flu shot- Every fall.  Tetanus shot- Every 10 years.  Menactra- Single dose; prevents meningitis.  Take these steps  Don't smoke- If you do smoke,  ask your healthcare provider about quitting. For tips on how to quit, go to www.smokefree.gov or call 1-800-QUIT-NOW.  Be physically active- Exercise 5 days a week for at least 30 minutes.  If you are not already physically active start slow and gradually work up to 30 minutes of moderate physical activity.  Examples of moderate activity include walking briskly, mowing the yard, dancing, swimming bicycling, etc.  Eat a healthy diet- Eat a variety of healthy foods such as fruits, vegetables, low fat milk, low fat cheese, yogurt, lean meats, poultry, fish, beans, tofu, etc.  For more information on healthy eating, go to www.thenutritionsource.org  Drink alcohol in moderation- Limit alcohol intake two drinks or less a day.  Never drink and drive.  Dentist- Brush and floss teeth twice daily; visit your dentis twice a year.  Depression-Your emotional health is as important as your physical health.  If you're feeling down, losing interest in things you normally enjoy please talk with your healthcare provider.  Gun Safety- If you keep a gun in your home, keep it unloaded and with the safety lock on.  Bullets should be stored separately.  Helmet use- Always wear a helmet when riding a motorcycle, bicycle, rollerblading or skateboarding.  Safe sex- If you may be exposed to a sexually transmitted infection, use a condom  Seat belts- Seat bels can save your life; always wear one.  Smoke/Carbon Monoxide detectors- These detectors need to be installed on the  appropriate level of your home.  Replace batteries at least once a year.  Skin Cancer- When out in the sun, cover up and use sunscreen SPF 15 or higher.  Violence- If anyone is threatening or hurting you, please tell your healthcare provider.   Lipoma  A lipoma is a noncancerous (benign) tumor that is made up of fat cells. This is a very common type of soft-tissue growth. Lipomas are usually found under the skin (subcutaneous). They may occur in  any tissue of the body that contains fat. Common areas for lipomas to appear include the back, shoulders, buttocks, and thighs.  Lipomas grow slowly, and they are usually painless. Most lipomas do not cause problems and do not require treatment. What are the causes? The cause of this condition is not known. What increases the risk? You are more likely to develop this condition if:  You are 31-72 years old.  You have a family history of lipomas. What are the signs or symptoms? A lipoma usually appears as a small, round bump under the skin. In most cases, the lump will:  Feel soft or rubbery.  Not cause pain or other symptoms. However, if a lipoma is located in an area where it pushes on nerves, it can become painful or cause other symptoms. How is this diagnosed? A lipoma can usually be diagnosed with a physical exam. You may also have tests to confirm the diagnosis and to rule out other conditions. Tests may include:  Imaging tests, such as a CT scan or MRI.  Removal of a tissue sample to be looked at under a microscope (biopsy). How is this treated? Treatment for this condition depends on the size of the lipoma and whether it is causing any symptoms.  For small lipomas that are not causing problems, no treatment is needed.  If a lipoma is bigger or it causes problems, surgery may be done to remove the lipoma. Lipomas can also be removed to improve appearance. Most often, the procedure is done after applying a medicine that numbs the area (local anesthetic). Follow these instructions at home:  Watch your lipoma for any changes.  Keep all follow-up visits as told by your health care provider. This is important. Contact a health care provider if:  Your lipoma becomes larger or hard.  Your lipoma becomes painful, red, or increasingly swollen. These could be signs of infection or a more serious condition. Get help right away if:  You develop tingling or numbness in an area near  the lipoma. This could indicate that the lipoma is causing nerve damage. Summary  A lipoma is a noncancerous tumor that is made up of fat cells.  Most lipomas do not cause problems and do not require treatment.  If a lipoma is bigger or it causes problems, surgery may be done to remove the lipoma. This information is not intended to replace advice given to you by your health care provider. Make sure you discuss any questions you have with your health care provider. Document Released: 09/14/2002 Document Revised: 09/10/2017 Document Reviewed: 09/10/2017 Elsevier Interactive Patient Education  2019 [place].    Insomnia Insomnia is a sleep disorder that makes it difficult to fall asleep or stay asleep. Insomnia can cause fatigue, low energy, difficulty concentrating, mood swings, and poor performance at work or school. There are three different ways to classify insomnia:  Difficulty falling asleep.  Difficulty staying asleep.  Waking up too early in the morning. Any type of insomnia can be  long-term (chronic) or short-term (acute). Both are common. Short-term insomnia usually lasts for three months or less. Chronic insomnia occurs at least three times a week for longer than three months. What are the causes? Insomnia may be caused by another condition, situation, or substance, such as:  Anxiety.  Certain medicines.  Gastroesophageal reflux disease (GERD) or other gastrointestinal conditions.  Asthma or other breathing conditions.  Restless legs syndrome, sleep apnea, or other sleep disorders.  Chronic pain.  Menopause.  Stroke.  Abuse of alcohol, tobacco, or illegal drugs.  Mental health conditions, such as depression.  Caffeine.  Neurological disorders, such as Alzheimer's disease.  An overactive thyroid (hyperthyroidism). Sometimes, the cause of insomnia may not be known. What increases the risk? Risk factors for insomnia include:  Gender. Women are  affected more often than men.  Age. Insomnia is more common as you get older.  Stress.  Lack of exercise.  Irregular work schedule or working night shifts.  Traveling between different time zones.  Certain medical and mental health conditions. What are the signs or symptoms? If you have insomnia, the main symptom is having trouble falling asleep or having trouble staying asleep. This may lead to other symptoms, such as:  Feeling fatigued or having low energy.  Feeling nervous about going to sleep.  Not feeling rested in the morning.  Having trouble concentrating.  Feeling irritable, anxious, or depressed. How is this diagnosed? This condition may be diagnosed based on:  Your symptoms and medical history. Your health care provider may ask about: ? Your sleep habits. ? Any medical conditions you have. ? Your mental health.  A physical exam. How is this treated? Treatment for insomnia depends on the cause. Treatment may focus on treating an underlying condition that is causing insomnia. Treatment may also include:  Medicines to help you sleep.  Counseling or therapy.  Lifestyle adjustments to help you sleep better. Follow these instructions at home: Eating and drinking   Limit or avoid alcohol, caffeinated beverages, and cigarettes, especially close to bedtime. These can disrupt your sleep.  Do not eat a large meal or eat spicy foods right before bedtime. This can lead to digestive discomfort that can make it hard for you to sleep. Sleep habits   Keep a sleep diary to help you and your health care provider figure out what could be causing your insomnia. Write down: ? When you sleep. ? When you wake up during the night. ? How well you sleep. ? How rested you feel the next day. ? Any side effects of medicines you are taking. ? What you eat and drink.  Make your bedroom a dark, comfortable place where it is easy to fall asleep. ? Put up shades or blackout  curtains to block light from outside. ? Use a white noise machine to block noise. ? Keep the temperature cool.  Limit screen use before bedtime. This includes: ? Watching TV. ? Using your smartphone, tablet, or computer.  Stick to a routine that includes going to bed and waking up at the same times every day and night. This can help you fall asleep faster. Consider making a quiet activity, such as reading, part of your nighttime routine.  Try to avoid taking naps during the day so that you sleep better at night.  Get out of bed if you are still awake after 15 minutes of trying to sleep. Keep the lights down, but try reading or doing a quiet activity. When you feel sleepy, go  back to bed. General instructions  Take over-the-counter and prescription medicines only as told by your health care provider.  Exercise regularly, as told by your health care provider. Avoid exercise starting several hours before bedtime.  Use relaxation techniques to manage stress. Ask your health care provider to suggest some techniques that may work well for you. These may include: ? Breathing exercises. ? Routines to release muscle tension. ? Visualizing peaceful scenes.  Make sure that you drive carefully. Avoid driving if you feel very sleepy.  Keep all follow-up visits as told by your health care provider. This is important. Contact a health care provider if:  You are tired throughout the day.  You have trouble in your daily routine due to sleepiness.  You continue to have sleep problems, or your sleep problems get worse. Get help right away if:  You have serious thoughts about hurting yourself or someone else. If you ever feel like you may hurt yourself or others, or have thoughts about taking your own life, get help right away. You can go to your nearest emergency department or call:  Your local emergency services (911 in the [place]  A suicide crisis helpline, such as the [place] at (614)183-0455. This is open 24 hours a day. Summary  Insomnia is a sleep disorder that makes it difficult to fall asleep or stay asleep.  Insomnia can be long-term (chronic) or short-term (acute).  Treatment for insomnia depends on the cause. Treatment may focus on treating an underlying condition that is causing insomnia.  Keep a sleep diary to help you and your health care provider figure out what could be causing your insomnia. This information is not intended to replace advice given to you by your health care provider. Make sure you discuss any questions you have with your health care provider. Document Released: 09/21/2000 Document Revised: 07/04/2017 Document Reviewed: 07/04/2017 Elsevier Interactive Patient Education  2019 [employer].    Steps to Quit Smoking  Smoking tobacco can be harmful to your health and can affect almost every organ in your body. Smoking puts you, and those around you, at risk for developing many serious chronic diseases. Quitting smoking is difficult, but it is one of the best things that you can do for your health. It is never too late to quit. What are the benefits of quitting smoking? When you quit smoking, you lower your risk of developing serious diseases and conditions, such as:  Lung cancer or lung disease, such as COPD.  Heart disease.  Stroke.  Heart attack.  Infertility.  Osteoporosis and bone fractures. Additionally, symptoms such as coughing, wheezing, and shortness of breath may get better when you quit. You may also find that you get sick less often because your body is stronger at fighting off colds and infections. If you are pregnant, quitting smoking can help to reduce your chances of having a baby of low birth weight. How do I get ready to quit? When you decide to quit smoking, create a plan to make sure that you are successful. Before you quit:  Pick a date to quit. Set a date within the next two weeks  to give you time to prepare.  Write down the reasons why you are quitting. Keep this list in places where you will see it often, such as on your bathroom mirror or in your car or wallet.  Identify the people, places, things, and activities that make you want to smoke (triggers) and avoid them.  Make sure to take these actions: ? Throw away all cigarettes at home, at work, and in your car. ? Throw away smoking accessories, such as Scientist, research (medical). ? Clean your car and make sure to empty the ashtray. ? Clean your home, including curtains and carpets.  Tell your family, friends, and coworkers that you are quitting. Support from your loved ones can make quitting easier.  Talk with your health care provider about your options for quitting smoking.  Find out what treatment options are covered by your health insurance. What strategies can I use to quit smoking? Talk with your healthcare provider about different strategies to quit smoking. Some strategies include:  Quitting smoking altogether instead of gradually lessening how much you smoke over a period of time. Research shows that quitting cold [place] is more successful than gradually quitting.  Attending in-person counseling to help you build problem-solving skills. You are more likely to have success in quitting if you attend several counseling sessions. Even short sessions of 10 minutes can be effective.  Finding resources and support systems that can help you to quit smoking and remain smoke-free after you quit. These resources are most helpful when you use them often. They can include: ? Online chats with a Social worker. ? Telephone quitlines. ? Careers information officer. ? Support groups or group counseling. ? Text messaging programs. ? Mobile phone applications.  Taking medicines to help you quit smoking. (If you are pregnant or breastfeeding, talk with your health care provider first.) Some medicines contain nicotine and some  do not. Both types of medicines help with cravings, but the medicines that include nicotine help to relieve withdrawal symptoms. Your health care provider may recommend: ? Nicotine patches, gum, or lozenges. ? Nicotine inhalers or sprays. ? Non-nicotine medicine that is taken by mouth. Talk with your health care provider about combining strategies, such as taking medicines while you are also receiving in-person counseling. Using these two strategies together makes you more likely to succeed in quitting than if you used either strategy on its own. If you are pregnant or breastfeeding, talk with your health care provider about finding counseling or other support strategies to quit smoking. Do not take medicine to help you quit smoking unless told to do so by your health care provider. What things can I do to make it easier to quit? Quitting smoking might feel overwhelming at first, but there is a lot that you can do to make it easier. Take these important actions:  Reach out to your family and friends and ask that they support and encourage you during this time. Call telephone quitlines, reach out to support groups, or work with a counselor for support.  Ask people who smoke to avoid smoking around you.  Avoid places that trigger you to smoke, such as bars, parties, or smoke-break areas at work.  Spend time around people who do not smoke.  Lessen stress in your life, because stress can be a smoking trigger for some people. To lessen stress, try: ? Exercising regularly. ? Deep-breathing exercises. ? Yoga. ? Meditating. ? Performing a body scan. This involves closing your eyes, scanning your body from head to toe, and noticing which parts of your body are particularly tense. Purposefully relax the muscles in those areas.  Download or purchase mobile phone or tablet apps (applications) that can help you stick to your quit plan by providing reminders, tips, and encouragement. There are many free  apps, such as QuitGuide from the [employer]  for Disease Control and Prevention). You can find other support for quitting smoking (smoking cessation) through smokefree.gov and other websites. How will I feel when I quit smoking? Within the first 24 hours of quitting smoking, you may start to feel some withdrawal symptoms. These symptoms are usually most noticeable 2-3 days after quitting, but they usually do not last beyond 2-3 weeks. Changes or symptoms that you might experience include:  Mood swings.  Restlessness, anxiety, or irritation.  Difficulty concentrating.  Dizziness.  Strong cravings for sugary foods in addition to nicotine.  Mild weight gain.  Constipation.  Nausea.  Coughing or a sore throat.  Changes in how your medicines work in your body.  A depressed mood.  Difficulty sleeping (insomnia). After the first 2-3 weeks of quitting, you may start to notice more positive results, such as:  Improved sense of smell and taste.  Decreased coughing and sore throat.  Slower heart rate.  Lower blood pressure.  Clearer skin.  The ability to breathe more easily.  Fewer sick days. Quitting smoking is very challenging for most people. Do not get discouraged if you are not successful the first time. Some people need to make many attempts to quit before they achieve long-term success. Do your best to stick to your quit plan, and talk with your health care provider if you have any questions or concerns. This information is not intended to replace advice given to you by your health care provider. Make sure you discuss any questions you have with your health care provider. Document Released: 09/18/2001 Document Revised: 04/30/2017 Document Reviewed: 02/08/2015 Elsevier Interactive Patient Education  2019 [employer].

## 2019-02-25 NOTE — Progress Notes (Unsigned)
   Subjective:    Patient ID: Brian Black, male    DOB: 06/15/1981, 38 y.o.   MRN: [id number]  HPI Brian Black is a 38 y.o. male Presents today for: No chief complaint on file.     There are no active problems to display for this patient.  Past Medical History:  Diagnosis Date  . Alcohol abuse   . Substance abuse ([place])    No past surgical history on file. No Known Allergies Prior to Admission medications   Medication Sig Start Date End Date Taking? Authorizing Provider  gabapentin (NEURONTIN) 100 MG capsule Take 100 mg by mouth 3 (three) times daily.      [provider]  ofloxacin (OCUFLOX) 0.3 % ophthalmic solution 2 drops in the eye every 3 hours for the first 2 days, then 1 drop four times per day for 5 days Patient not taking: Reported on 02/25/2019 09/27/16   Ivar Drape D, PA  traZODone (DESYREL) 150 MG tablet Take 150 mg by mouth at bedtime.     [provider]   Social History   Socioeconomic History  . Marital status: Single    Spouse name: Not on file  . Number of children: Not on file  . Years of education: Not on file  . Highest education level: Not on file  Occupational History  . Not on file  Social Needs  . Financial resource strain: Not on file  . Food insecurity:    Worry: Not on file    Inability: Not on file  . Transportation needs:    Medical: Not on file    Non-medical: Not on file  Tobacco Use  . Smoking status: Current Every Day Smoker  . Smokeless tobacco: Former Network engineer and Sexual Activity  . Alcohol use: Yes  . Drug use: No  . Sexual activity: Not on file  Lifestyle  . Physical activity:    Days per week: Not on file    Minutes per session: Not on file  . Stress: Not on file  Relationships  . Social connections:    Talks on phone: Not on file    Gets together: Not on file    Attends religious service: Not on file    Active member of club or organization: Not on file    Attends  meetings of clubs or organizations: Not on file    Relationship status: Not on file  . Intimate partner violence:    Fear of current or ex partner: Not on file    Emotionally abused: Not on file    Physically abused: Not on file    Forced sexual activity: Not on file  Other Topics Concern  . Not on file  Social History Narrative  . Not on file    Review of Systems     Objective:   Physical Exam There were no vitals filed for this visit.        Assessment & Plan:

## 2019-02-26 LAB — COMPREHENSIVE METABOLIC PANEL
ALT: 44 IU/L (ref 0–44)
AST: 42 IU/L — ABNORMAL HIGH (ref 0–40)
Albumin/Globulin Ratio: 1.6 (ref 1.2–2.2)
Albumin: 4.7 g/dL (ref 4.0–5.0)
Alkaline Phosphatase: 75 IU/L (ref 39–117)
BUN/Creatinine Ratio: 13 (ref 9–20)
BUN: 14 mg/dL (ref 6–20)
Bilirubin Total: 0.8 mg/dL (ref 0.0–1.2)
CO2: 23 mmol/L (ref 20–29)
Calcium: 9.3 mg/dL (ref 8.7–10.2)
Chloride: 97 mmol/L (ref 96–106)
Creatinine, Ser: 1.05 mg/dL (ref 0.76–1.27)
GFR calc Af Amer: 106 mL/min/{1.73_m2} (ref >59)
GFR calc non Af Amer: 92 mL/min/{1.73_m2} (ref >59)
Globulin, Total: 3 g/dL (ref 1.5–4.5)
Glucose: 111 mg/dL — ABNORMAL HIGH (ref 65–99)
Potassium: 4.3 mmol/L (ref 3.5–5.2)
Sodium: 134 mmol/L (ref 134–144)
Total Protein: 7.7 g/dL (ref 6.0–8.5)

## 2019-02-26 LAB — LIPID PANEL
Chol/HDL Ratio: 3.2 ratio (ref 0.0–5.0)
Cholesterol, Total: 216 mg/dL — ABNORMAL HIGH (ref 100–199)
HDL: 68 mg/dL (ref >39)
LDL Calculated: 123 mg/dL — ABNORMAL HIGH (ref 0–99)
Triglycerides: 124 mg/dL (ref 0–149)
VLDL Cholesterol Cal: 25 mg/dL (ref 5–40)

## 2019-02-26 LAB — TSH: TSH: 1.78 u[IU]/mL (ref 0.450–4.500)

## 2019-02-26 LAB — CBC
Hematocrit: 42.5 % (ref 37.5–51.0)
Hemoglobin: 14.5 g/dL (ref 13.0–17.7)
MCH: 28.1 pg (ref 26.6–33.0)
MCHC: 34.1 g/dL (ref 31.5–35.7)
MCV: 82 fL (ref 79–97)
Platelets: 155 10*3/uL (ref 150–450)
RBC: 5.16 x10E6/uL (ref 4.14–5.80)
RDW: 13.1 % (ref 11.6–15.4)
WBC: 3.4 10*3/uL (ref 3.4–10.8)

## 2019-03-01 ENCOUNTER — Encounter: Payer: Self-pay | Admitting: Family Medicine

## 2019-05-29 ENCOUNTER — Ambulatory Visit: Payer: BLUE CROSS/BLUE SHIELD | Admitting: Family Medicine

## 2019-06-19 ENCOUNTER — Ambulatory Visit: Payer: BLUE CROSS/BLUE SHIELD | Admitting: Family Medicine

## 2019-10-10 ENCOUNTER — Ambulatory Visit (HOSPITAL_COMMUNITY)
Admission: EM | Admit: 2019-10-10 | Discharge: 2019-10-10 | Disposition: A | Discharge status: Home / Self Care | Payer: BLUE CROSS/BLUE SHIELD | Attending: Family Medicine | Admitting: Family Medicine

## 2019-10-10 ENCOUNTER — Other Ambulatory Visit: Payer: Self-pay

## 2019-10-10 ENCOUNTER — Encounter (HOSPITAL_COMMUNITY): Payer: Self-pay

## 2019-10-10 DIAGNOSIS — F17200 Nicotine dependence, unspecified, uncomplicated: Secondary | ICD-10-CM | POA: Insufficient documentation

## 2019-10-10 DIAGNOSIS — J029 Acute pharyngitis, unspecified: Secondary | ICD-10-CM | POA: Diagnosis not present

## 2019-10-10 DIAGNOSIS — Z20822 Contact with and (suspected) exposure to covid-19: Secondary | ICD-10-CM | POA: Insufficient documentation

## 2019-10-10 DIAGNOSIS — F1921 Other psychoactive substance dependence, in remission: Secondary | ICD-10-CM | POA: Insufficient documentation

## 2019-10-10 DIAGNOSIS — F1021 Alcohol dependence, in remission: Secondary | ICD-10-CM | POA: Insufficient documentation

## 2019-10-10 LAB — POCT RAPID STREP A: Streptococcus, Group A Screen (Direct): NEGATIVE

## 2019-10-10 MED ORDER — TRAMADOL HCL 50 MG PO TABS: 50.0000 mg | ORAL_TABLET | Freq: Four times a day (QID) | ORAL | 0 refills | Status: AC | PRN

## 2019-10-10 MED ORDER — IBUPROFEN 800 MG PO TABS: 800.0000 mg | ORAL_TABLET | Freq: Three times a day (TID) | ORAL | 0 refills | Status: AC

## 2019-10-10 MED ORDER — PREDNISONE 10 MG (21) PO TBPK: ORAL_TABLET | Freq: Every day | ORAL | 0 refills | Status: AC

## 2019-10-10 NOTE — ED Triage Notes (Signed)
Patient presents to Urgent Care with complaints of sore throat since 2-3 days ago. Patient reports the left side of his face is swollen and it is very painful to eat solid food.

## 2019-10-10 NOTE — Discharge Instructions (Addendum)
.  You may use over the counter ibuprofen or acetaminophen as needed.  For a sore throat, over the counter products such as [employer] Peroxyl Mouth Sore Rinse or Chloraseptic Sore Throat Spray may provide some temporary relief. Your rapid strep test was negative today. We have sent your throat swab for culture and will let you know of any positive results.  If your Covid-19 test is positive, you will receive a phone call from [facility] regarding your results. Negative test results are not called. Both positive and negative results area always visible on MyChart. If you do not have a MyChart account, sign up instructions are in your discharge papers.  Be aware, pain medications may cause drowsiness. Please do not drive, operate heavy machinery or make important decisions while on this medication, it can cloud your judgement.

## 2019-10-11 LAB — NOVEL CORONAVIRUS, NAA (HOSP ORDER, SEND-OUT TO REF LAB; TAT 18-24 HRS): SARS-CoV-2, NAA: NOT DETECTED

## 2019-10-12 LAB — CULTURE, GROUP A STREP (THRC)

## 2019-10-12 NOTE — ED Provider Notes (Signed)
[name]   [id number] 10/10/19 Arrival Time: [id number]  ASSESSMENT & PLAN:  1. Sore throat     No signs of peritonsillar abscess though he describes significant throat pain and difficulty eating/drinking secondary to pain. COVID testing sent. See work note for self-isolation guidelines pending test results. Discussed.  Begin: Meds ordered this encounter  Medications  . ibuprofen (ADVIL) 800 MG tablet    Sig: Take 1 tablet (800 mg total) by mouth 3 (three) times daily with meals.    Dispense:  21 tablet    Refill:  0  . traMADol (ULTRAM) 50 MG tablet    Sig: Take 1 tablet (50 mg total) by mouth every 6 (six) hours as needed.    Dispense:  15 tablet    Refill:  0  . predniSONE (STERAPRED UNI-PAK 21 TAB) 10 MG (21) TBPK tablet    Sig: Take by mouth daily. Take as directed.    Dispense:  21 tablet    Refill:  0     Pending:  CULTURE, GROUP A STREP ([place])  NOVEL CORONAVIRUS, NAA (HOSP ORDER, SEND-OUT TO REF LAB; TAT 18-24 HRS)      OTC analgesics and throat care as needed   Discharge Instructions      .You may use over the counter ibuprofen or acetaminophen as needed.   For a sore throat, over the counter products such as [employer]gate Peroxyl Mouth Sore Rinse or Chloraseptic Sore Throat Spray may provide some temporary relief.  Your rapid strep test was negative today. We have sent your throat swab for culture and will let you know of any positive results.  If your Covid-19 test is positive, you will receive a phone call from [facility] regarding your results. Negative test results are not called. Both positive and negative results area always visible on MyChart. If you do not have a MyChart account, sign up instructions are in your discharge papers.  Be aware, pain medications may cause drowsiness. Please do not drive, operate heavy machinery or make important decisions while on this medication, it can cloud your judgement.     [place] Controlled Substances  Registry consulted for this patient. I feel the risk/benefit ratio today is favorable for proceeding with this prescription for a controlled substance. Medication sedation precautions given.  Reviewed expectations re: course of current medical issues. Questions answered. Outlined signs and symptoms indicating need for more acute intervention. Patient verbalized understanding. After Visit Summary given.   SUBJECTIVE:  Brian Black is a 39 y.o. male who reports a sore throat. Describes as painful swallowing and feeling pain of left neck. Onset gradual beginning 2-3 d ago. Symptoms have progressed to a point and plateaued since beginning; without voice changes. No respiratory symptoms. Tolerating PO intake but reports discomfort with swallowing. "Hard time eating because of the pain". No specific alleviating factors. Fever: absent. No neck pain or swelling. No associated nausea, vomiting, or abdominal pain. Known sick contacts: none. Recent travel: none. OTC treatment: OTC analgesics without relief.  ROS: As per HPI. All other systems negative.   OBJECTIVE:  Vitals:   10/10/19 1502  BP: (!) 142/99  Pulse: 80  Resp: 16  Temp: 98 F (36.7 C)  TempSrc: Oral  SpO2: 98%     General appearance: alert; no distress HEENT: throat with moderate erythema, cobblestoning and without tonsillar hypertrophy; uvula is midline Neck: supple with FROM; L-sided cervical LAD that is tender CV: RRR Lungs: clear to auscultation bilaterally; unlabored Abd: soft; non-tender Skin: reveals  no rash; warm and dry Ext: no edema Psychological: alert and cooperative; normal mood and affect  No Known Allergies  Past Medical History:  Diagnosis Date  . Alcohol abuse   . Substance abuse ([place])    Social History   Socioeconomic History  . Marital status: Single    Spouse name: Not on file  . Number of children: Not on file  . Years of education: Not on file  . Highest education level: Not on file   Occupational History  . Not on file  Tobacco Use  . Smoking status: Current Every Day Smoker  . Smokeless tobacco: Former Network engineer and Sexual Activity  . Alcohol use: Yes    Comment: occ  . Drug use: No  . Sexual activity: Not on file  Other Topics Concern  . Not on file  Social History Narrative  . Not on file   Social Determinants of Health   Financial Resource Strain:   . Difficulty of Paying Living Expenses: Not on file  Food Insecurity:   . Worried About Charity fundraiser in the Last Year: Not on file  . Ran Out of Food in the Last Year: Not on file  Transportation Needs:   . Lack of Transportation (Medical): Not on file  . Lack of Transportation (Non-Medical): Not on file  Physical Activity:   . Days of Exercise per Week: Not on file  . Minutes of Exercise per Session: Not on file  Stress:   . Feeling of Stress : Not on file  Social Connections:   . Frequency of Communication with Friends and Family: Not on file  . Frequency of Social Gatherings with Friends and Family: Not on file  . Attends Religious Services: Not on file  . Active Member of Clubs or Organizations: Not on file  . Attends Archivist Meetings: Not on file  . Marital Status: Not on file  Intimate Partner Violence:   . Fear of Current or Ex-Partner: Not on file  . Emotionally Abused: Not on file  . Physically Abused: Not on file  . Sexually Abused: Not on file   Family History  Problem Relation Age of Onset  . Healthy Mother   . Healthy Father           Vanessa Kick, MD 10/12/19 [phone]

## 2022-02-07 ENCOUNTER — Emergency Department (HOSPITAL_BASED_OUTPATIENT_CLINIC_OR_DEPARTMENT_OTHER)
Admission: EM | Admit: 2022-02-07 | Discharge: 2022-02-07 | Disposition: A | Discharge status: Home / Self Care | Payer: BLUE CROSS/BLUE SHIELD | Attending: Emergency Medicine | Admitting: Emergency Medicine

## 2022-02-07 ENCOUNTER — Emergency Department (HOSPITAL_BASED_OUTPATIENT_CLINIC_OR_DEPARTMENT_OTHER): Payer: BLUE CROSS/BLUE SHIELD

## 2022-02-07 ENCOUNTER — Encounter (HOSPITAL_BASED_OUTPATIENT_CLINIC_OR_DEPARTMENT_OTHER): Payer: Self-pay

## 2022-02-07 DIAGNOSIS — S4992XA Unspecified injury of left shoulder and upper arm, initial encounter: Secondary | ICD-10-CM | POA: Diagnosis not present

## 2022-02-07 DIAGNOSIS — S069X9A Unspecified intracranial injury with loss of consciousness of unspecified duration, initial encounter: Secondary | ICD-10-CM | POA: Diagnosis not present

## 2022-02-07 DIAGNOSIS — Y92410 Unspecified street and highway as the place of occurrence of the external cause: Secondary | ICD-10-CM | POA: Insufficient documentation

## 2022-02-07 DIAGNOSIS — S199XXA Unspecified injury of neck, initial encounter: Secondary | ICD-10-CM | POA: Diagnosis not present

## 2022-02-07 MED ORDER — OXYCODONE-ACETAMINOPHEN 5-325 MG PO TABS
1.0000 | ORAL_TABLET | Freq: Once | ORAL | Status: AC
  Administered 2022-02-07: 1 via ORAL
  Filled 2022-02-07: qty 1

## 2022-02-07 MED ORDER — METHOCARBAMOL 500 MG PO TABS: 500.0000 mg | ORAL_TABLET | Freq: Two times a day (BID) | ORAL | 0 refills | Status: AC
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
## 2022-02-07 NOTE — ED Notes (Signed)
Patient transported to CT 
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
## 2022-04-25 ENCOUNTER — Ambulatory Visit: Payer: BLUE CROSS/BLUE SHIELD | Admitting: Family Medicine

## 2022-04-30 ENCOUNTER — Ambulatory Visit: Payer: BLUE CROSS/BLUE SHIELD | Admitting: Family Medicine
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
## 2023-06-26 DIAGNOSIS — Z23 Encounter for immunization: Secondary | ICD-10-CM | POA: Diagnosis not present
